# Patient Record
Sex: Female | Born: 1960 | Race: Black or African American | Hispanic: No | Marital: Single | Smoking: Former smoker
Health system: Southern US, Community
[De-identification: ages and names within clinical notes are randomized; demographics above are authoritative.]

## PROBLEM LIST (undated history)

## (undated) DIAGNOSIS — J45909 Unspecified asthma, uncomplicated: Secondary | ICD-10-CM

## (undated) DIAGNOSIS — K219 Gastro-esophageal reflux disease without esophagitis: Secondary | ICD-10-CM

## (undated) DIAGNOSIS — E079 Disorder of thyroid, unspecified: Secondary | ICD-10-CM

## (undated) DIAGNOSIS — K259 Gastric ulcer, unspecified as acute or chronic, without hemorrhage or perforation: Secondary | ICD-10-CM

## (undated) HISTORY — DX: Disorder of thyroid, unspecified: E07.9

## (undated) HISTORY — DX: Gastro-esophageal reflux disease without esophagitis: K21.9

## (undated) HISTORY — PX: OOPHORECTOMY: SHX86

## (undated) HISTORY — PX: OTHER SURGICAL HISTORY: SHX169

## (undated) HISTORY — DX: Unspecified asthma, uncomplicated: J45.909

## (undated) HISTORY — DX: Gastric ulcer, unspecified as acute or chronic, without hemorrhage or perforation: K25.9

## (undated) HISTORY — PX: ABDOMINAL HYSTERECTOMY: SHX81

---

## 1998-12-08 ENCOUNTER — Other Ambulatory Visit: Admission: RE | Admit: 1998-12-08 | Discharge: 1998-12-08 | Payer: Self-pay | Admitting: Obstetrics and Gynecology

## 1999-07-22 ENCOUNTER — Encounter: Payer: Self-pay | Admitting: Emergency Medicine

## 1999-07-22 ENCOUNTER — Ambulatory Visit (HOSPITAL_COMMUNITY): Admission: AD | Admit: 1999-07-22 | Discharge: 1999-07-22 | Payer: Self-pay | Admitting: Emergency Medicine

## 1999-12-26 ENCOUNTER — Other Ambulatory Visit: Admission: RE | Admit: 1999-12-26 | Discharge: 1999-12-26 | Payer: Self-pay | Admitting: Obstetrics and Gynecology

## 2000-02-15 ENCOUNTER — Ambulatory Visit (HOSPITAL_COMMUNITY): Admission: RE | Admit: 2000-02-15 | Discharge: 2000-02-15 | Payer: Self-pay | Admitting: Obstetrics and Gynecology

## 2000-02-15 ENCOUNTER — Encounter: Payer: Self-pay | Admitting: Obstetrics and Gynecology

## 2000-03-01 ENCOUNTER — Encounter: Payer: Self-pay | Admitting: Obstetrics and Gynecology

## 2000-03-11 ENCOUNTER — Encounter (INDEPENDENT_AMBULATORY_CARE_PROVIDER_SITE_OTHER): Payer: Self-pay

## 2000-03-11 ENCOUNTER — Inpatient Hospital Stay (HOSPITAL_COMMUNITY): Admission: RE | Admit: 2000-03-11 | Discharge: 2000-03-14 | Payer: Self-pay | Admitting: Obstetrics and Gynecology

## 2001-01-28 ENCOUNTER — Other Ambulatory Visit: Admission: RE | Admit: 2001-01-28 | Discharge: 2001-01-28 | Payer: Self-pay | Admitting: Obstetrics and Gynecology

## 2002-02-03 ENCOUNTER — Other Ambulatory Visit: Admission: RE | Admit: 2002-02-03 | Discharge: 2002-02-03 | Payer: Self-pay | Admitting: Obstetrics and Gynecology

## 2002-07-22 ENCOUNTER — Encounter: Payer: Self-pay | Admitting: Obstetrics and Gynecology

## 2002-07-22 ENCOUNTER — Ambulatory Visit (HOSPITAL_COMMUNITY): Admission: RE | Admit: 2002-07-22 | Discharge: 2002-07-22 | Payer: Self-pay | Admitting: Obstetrics and Gynecology

## 2003-01-26 ENCOUNTER — Ambulatory Visit (HOSPITAL_COMMUNITY): Admission: RE | Admit: 2003-01-26 | Discharge: 2003-01-26 | Payer: Self-pay | Admitting: Internal Medicine

## 2003-01-26 ENCOUNTER — Encounter: Payer: Self-pay | Admitting: Internal Medicine

## 2003-02-16 ENCOUNTER — Other Ambulatory Visit: Admission: RE | Admit: 2003-02-16 | Discharge: 2003-02-16 | Payer: Self-pay | Admitting: Obstetrics and Gynecology

## 2003-04-27 ENCOUNTER — Encounter (HOSPITAL_BASED_OUTPATIENT_CLINIC_OR_DEPARTMENT_OTHER): Payer: Self-pay | Admitting: General Surgery

## 2003-05-04 ENCOUNTER — Encounter (INDEPENDENT_AMBULATORY_CARE_PROVIDER_SITE_OTHER): Payer: Self-pay

## 2003-05-04 ENCOUNTER — Ambulatory Visit (HOSPITAL_COMMUNITY): Admission: RE | Admit: 2003-05-04 | Discharge: 2003-05-04 | Payer: Self-pay | Admitting: General Surgery

## 2003-09-05 ENCOUNTER — Inpatient Hospital Stay (HOSPITAL_COMMUNITY): Admission: AD | Admit: 2003-09-05 | Discharge: 2003-09-05 | Payer: Self-pay | Admitting: Obstetrics and Gynecology

## 2003-09-06 ENCOUNTER — Encounter (INDEPENDENT_AMBULATORY_CARE_PROVIDER_SITE_OTHER): Payer: Self-pay | Admitting: *Deleted

## 2003-09-06 ENCOUNTER — Inpatient Hospital Stay (HOSPITAL_COMMUNITY): Admission: RE | Admit: 2003-09-06 | Discharge: 2003-09-09 | Payer: Self-pay | Admitting: Obstetrics and Gynecology

## 2004-11-09 ENCOUNTER — Other Ambulatory Visit: Admission: RE | Admit: 2004-11-09 | Discharge: 2004-11-09 | Payer: Self-pay | Admitting: Obstetrics and Gynecology

## 2005-03-28 ENCOUNTER — Ambulatory Visit (HOSPITAL_COMMUNITY): Admission: RE | Admit: 2005-03-28 | Discharge: 2005-03-28 | Payer: Self-pay | Admitting: Internal Medicine

## 2005-11-12 ENCOUNTER — Other Ambulatory Visit: Admission: RE | Admit: 2005-11-12 | Discharge: 2005-11-12 | Payer: Self-pay | Admitting: Obstetrics and Gynecology

## 2006-11-14 ENCOUNTER — Other Ambulatory Visit: Admission: RE | Admit: 2006-11-14 | Discharge: 2006-11-14 | Payer: Self-pay | Admitting: Obstetrics and Gynecology

## 2007-03-10 ENCOUNTER — Encounter: Admission: RE | Admit: 2007-03-10 | Discharge: 2007-03-10 | Payer: Self-pay | Admitting: Gastroenterology

## 2007-12-02 ENCOUNTER — Other Ambulatory Visit: Admission: RE | Admit: 2007-12-02 | Discharge: 2007-12-02 | Payer: Self-pay | Admitting: Obstetrics and Gynecology

## 2008-01-15 ENCOUNTER — Ambulatory Visit (HOSPITAL_COMMUNITY): Admission: RE | Admit: 2008-01-15 | Discharge: 2008-01-15 | Payer: Self-pay | Admitting: Internal Medicine

## 2008-12-06 ENCOUNTER — Ambulatory Visit: Payer: Self-pay | Admitting: Obstetrics and Gynecology

## 2008-12-06 ENCOUNTER — Encounter: Payer: Self-pay | Admitting: Obstetrics and Gynecology

## 2008-12-06 ENCOUNTER — Other Ambulatory Visit: Admission: RE | Admit: 2008-12-06 | Discharge: 2008-12-06 | Payer: Self-pay | Admitting: Obstetrics and Gynecology

## 2008-12-08 ENCOUNTER — Ambulatory Visit: Payer: Self-pay | Admitting: Obstetrics and Gynecology

## 2009-12-19 ENCOUNTER — Ambulatory Visit (HOSPITAL_COMMUNITY): Admission: RE | Admit: 2009-12-19 | Discharge: 2009-12-19 | Payer: Self-pay | Admitting: Internal Medicine

## 2009-12-27 ENCOUNTER — Other Ambulatory Visit: Admission: RE | Admit: 2009-12-27 | Discharge: 2009-12-27 | Payer: Self-pay | Admitting: Obstetrics and Gynecology

## 2009-12-27 ENCOUNTER — Ambulatory Visit: Payer: Self-pay | Admitting: Obstetrics and Gynecology

## 2010-01-02 ENCOUNTER — Ambulatory Visit: Payer: Self-pay | Admitting: Obstetrics and Gynecology

## 2011-01-31 ENCOUNTER — Encounter (INDEPENDENT_AMBULATORY_CARE_PROVIDER_SITE_OTHER): Payer: 59 | Admitting: Obstetrics and Gynecology

## 2011-01-31 ENCOUNTER — Other Ambulatory Visit (HOSPITAL_COMMUNITY)
Admission: RE | Admit: 2011-01-31 | Discharge: 2011-01-31 | Disposition: A | Discharge status: Home / Self Care | Payer: 59 | Source: Ambulatory Visit | Attending: Obstetrics and Gynecology | Admitting: Obstetrics and Gynecology

## 2011-01-31 ENCOUNTER — Other Ambulatory Visit: Payer: Self-pay | Admitting: Obstetrics and Gynecology

## 2011-01-31 DIAGNOSIS — Z833 Family history of diabetes mellitus: Secondary | ICD-10-CM

## 2011-01-31 DIAGNOSIS — Z01419 Encounter for gynecological examination (general) (routine) without abnormal findings: Secondary | ICD-10-CM

## 2011-01-31 DIAGNOSIS — Z13220 Encounter for screening for lipoid disorders: Secondary | ICD-10-CM

## 2011-01-31 DIAGNOSIS — E039 Hypothyroidism, unspecified: Secondary | ICD-10-CM

## 2011-01-31 DIAGNOSIS — Z124 Encounter for screening for malignant neoplasm of cervix: Secondary | ICD-10-CM | POA: Insufficient documentation

## 2011-02-09 NOTE — Op Note (Signed)
NAME:  Charlene Mitchell, Charlene Mitchell                           ACCOUNT NO.:  [id number]   MEDICAL RECORD NO.:  [id number]                   PATIENT TYPE:  INP   LOCATION:  9306                                 FACILITY:  [facility]   PHYSICIAN:  Daniel L. Eda Paschal, M.D.           DATE OF BIRTH:  1961-07-18   DATE OF PROCEDURE:  09/06/2003  DATE OF DISCHARGE:                                 OPERATIVE REPORT   PREOPERATIVE DIAGNOSES:  1. Menorrhagia with anemia.  2. Fibroids.  3. Left adnexal mass.   POSTOPERATIVE DIAGNOSES:  1. Menorrhagia with anemia.  2. Fibroids.  3. Left adnexal mass.  4. Endometrioma of left ovary.   OPERATION:  Exploratory laparotomy, lysis of pelvic adhesions, total  abdominal hysterectomy, left salpingo-oophorectomy, excision of  endometriosis of cul-de-sac.   SURGEON:  Daniel L. Eda Paschal, M.D.   FIRST ASSISTANT:  Timothy P. Fontaine, M.D.   FINDINGS:  At the time of laparotomy, the patient had massive adhesions of  sigmoid colon to the left adnexa as well as to the top of the fundus.  Once  these were freed up, her uterus was enlarged by multiple fibroids.  The  patient had a 7 cm endometrioma of her left ovary.  She also had  endometriosis in the cul-de-sac of 2 to 3 cm that was pigmented.  The  patient had a small functional cyst of the right ovary which did not appear  to be neoplastic, otherwise the right ovary and tube were normal.   DESCRIPTION OF PROCEDURE:  After adequate general endotracheal anesthesia,  the patient was placed in the supine position, prepped and draped in the  usual sterile manner.  Foley catheter was inserted in the patient's bladder.  A Pfannenstiel incision was made.  The fascia was opened transversely.  At  this point, it was obvious that bowel was adherent to the peritoneum as well  as to the uterus.  A small area was found where the peritoneum could be  safely entered without injuring bowel.  At this point, it was felt that to  have  adequate exposure to do the surgery, the peritoneum should be opened  transversely.  The inferior epigastric arteries and veins were doubly  ligated with #1 chromic bilaterally.  The rectus muscle was then bovied to  separate it.  The peritoneum was then completely opened transversely, taking  care not to injure the bowel which was adherent to it.  Once the peritoneal  cavity was opened, peritoneal washings were obtained.  Following this,  massive adhesions of the sigmoid colon to the uterus and the left adnexa  were freed up.  This was done carefully and the bowel was not injured during  any of this.  At one point, however, the left adnexal mass ruptured and it  drained obvious chocolate endometriotic fluid.  Following lysis of all  adhesions, the left adnexa could be elevated.  The left round  ligament was  bovied and cut.  The retroperitoneal space was entered.  The ureter was  identified.  The IP ligament on the left was then clamped, cut, and doubly  suture ligated with 0 chromic.  On the right side, the ovary and tube were  adherent to the uterus but they could be freed off of it so that the ovary  and tube on the right could be spared and left and then the attachments of  the ovary and tube to the uterus were clamped, cut, and suture ligated with  #1 chromic catgut.  At this point, a small functional system of the right  ovary ruptured.  It was clearly not a neoplastic cyst. It was probably 1.5  cm.  It may have been the one that was identified on ultrasound back in  September.  There was no other obvious pathology on the right ovary.  Following freeing up of the uterus completely from adherence to both the  bladder flap and to the bowel, the round ligament on the right could then be  clamped, cut, and suture ligated with 3-0 chromic catgut.  The bladder flap  was advanced by sharp dissection as was the posterior peritoneum.  The  uterine arteries were clamped, cut, and doubly suture  ligated with #1  chromic catgut.  The balance of the cardinal ligaments and uterosacral  ligaments were clamped, cut, and suture ligated with #1 chromic catgut.  The  cervicovaginal junction was identified and entered with sharp dissection,  trimmed around.  Uterus, left tube and ovary were sent to pathology for  tissue diagnosis.  Angle sutures were placed in the angles of the vagina  with #1 chromic catgut and then the cuff was closed with figure-of-eights of  #1 chromic catgut.  Uterosacral and cardinal ligaments were supported to the  cuff for good bold support.  The area of endometriosis in the cul-de-sac was  sharply excised and the defect in the perineum was closed with a 3-0 Vicryl.  This was done mostly to control bleeding.  Two sponge, needle and instrument  counts were correct.  Peritoneal cavity was irrigated with copious Ringer's  lactate.  It was removed.  The fascia was closed with two running 0 Vicryl  and the skin was closed with staples.  Estimated blood loss for the entire  procedure was 500 mL with none replaced.  The patient left the operating  room after draining 450 mL of clear urine.  It should be noted that  preoperatively before the patient was given any sedation, we discussed the  fact that her preoperative hemoglobin was 8 due to menomenorrhagia.  We  discussed the risks of surgery including need for transfusion and risks of  HIV and hepatitis B and C.  This was also discussed with the patient by  anesthesia.  The patient is comfortable with these risks and would like to  proceed with the surgery rather than trying to be suppressed which might or  might not be possible with her large myomas and the endometriosis.                                               Daniel L. Eda Paschal, M.D.    Tonette Bihari  D:  09/06/2003  T:  09/06/2003  Job:  [id number]

## 2011-02-09 NOTE — Discharge Summary (Signed)
NAME:  Charlene Mitchell, Charlene Mitchell                           ACCOUNT NO.:  [id number]   MEDICAL RECORD NO.:  [id number]                   PATIENT TYPE:  INP   LOCATION:  9306                                 FACILITY:  [facility]   PHYSICIAN:  Daniel L. Eda Paschal, M.D.           DATE OF BIRTH:  05-25-1961   DATE OF ADMISSION:  09/06/2003  DATE OF DISCHARGE:  09/09/2003                                 DISCHARGE SUMMARY   DISCHARGE DIAGNOSES:  1. Menometrorrhagia with anemia.  2. Fibroids.  3. Left adnexal mass.  4. Endometrioma by left ovary.  5. Status post exploratory laparotomy, lysis of pelvic adhesions, total     abdominal hysterectomy, left salpingo-oophorectomy, excision of     endometriosis in cul-de-sac by Reuel Boom L. Eda Paschal, M.D. on September 06, 2003.   HISTORY:  This is a 50 year old female (gravida 0) who presented with a  persistent menometrorrhagia and left lower quadrant pain.  Periods were  lasting up to 14 days.  The patient did not feel that she desired pregnancy.  The patient was found on ultrasound to have a lower left ovarian cyst (8 x 8  x 5 cm), mostly avascular sequela.  There are two cystic areas with  septation between them; _____ 125 with _______ in the 30's.  She is also  found to have very large fibroids.  The patient presented for definitive  surgery.  The patient was also found to be anemic and preoperative  hemoglobin on September 06, 2003 was 8.0.   HOSPITAL COURSE:  The patient admitted on September 06, 2003 for definitive  surgery, and was counseled because of hemoglobin of 8.0 with the risks of  surgery and the need for a transfusion.  The patient desired the surgery.  Therefore, the patient underwent a exploratory laparotomy, lysis of pelvic  adhesions, Total abdominal hysterectomy, left salpingo-oophorectomy,  excision of endometriosis of the cul-de-sac and biopsy without  complications.   At the time of laparotomy the patient had massive adhesions of the  sigmoid  colon to the left adnexa, as well as to the top of the fundus.  The uterus  was found to be enlarged with multiple fibroids.  The patient had a 7 cm  endometrioma of the left ovary, also endometriosis in the cul-de-sac of 2-3  cm that was pigmented.  The patient had a small functional cyst on the right  ovary, which did not appear to be neoplastic; otherwise the right ovary and  tube were normal.   Postoperatively on September 07, 2003, the patient had a temperature max of  101; however, this decreased on the a.m. of September 08, 2003 to 100.  She  was without symptoms except for a headache.  She was voiding and in stable  condition.   On September 08, 2004 she was afebrile, voiding and in stable condition.  She  was felt stable for discharge.  Her  hemoglobin had been 6.0 postoperatively.  The patient was tolerating this well.   LABS:  September 07, 2003 -- Hemoglobin 6.0.   DISPOSITION:  The patient is discharged to home.   DISCHARGE MEDICATIONS:  Chromagen, Vicodin p.r.n. pain.   FOLLOW UP:  In three weeks to see M.D.  If any problems prior to that, plan  to be seen at the office.     Susa Loffler, P.A.                    Daniel L. Eda Paschal, M.D.    Ardath Sax  D:  10/04/2003  T:  10/04/2003  Job:  [id number]

## 2011-02-09 NOTE — Discharge Summary (Signed)
[facility]  Patient:    Charlene Mitchell, Charlene Mitchell                        MRN: [id number] Adm. Date:  [id number] Disc. Date: [id number] Attending:  Sharon Mt                           Discharge Summary  HISTORY OF PRESENT ILLNESS:  Patient is a 50 year old female with enlarging fibroids.  She entered the hospital for multiple myomectomies.  On the day of admission, through an exploratory laparotomy incision, multiple myomectomies were performed without difficulty.  Patients hemoglobin had been low to start with at 9 and it dropped postoperatively to 6.3, but patient was tolerating this well without tachycardia and was able to ambulate without difficulty, so she was not transfused.  She had an ileus postoperatively that responded to intravenous fluids and Dulcolax suppositories and by the third postoperative day she was doing well and passing gas.  DISCHARGE MEDICATIONS:  She was discharged on Percocet for pain relief and ferrous sulfate 325 mg b.i.d. for iron replacement.  DISCHARGE INSTRUCTIONS:  Soft diet to advance as tolerated.  Wound care was routine.  Condition on discharge was improved.  FINAL PATHOLOGY REPORT:  Multiple leiomyoma, all benign, which total weight not available at time of dictation.  DISCHARGE DIAGNOSIS:  Symptomatic fibroids.  OPERATION:  Multiple myomectomies. DD:  03/14/00 TD:  03/14/00 Job: [id number]

## 2011-02-09 NOTE — Op Note (Signed)
NAME:  Charlene Mitchell, Charlene Mitchell                           ACCOUNT NO.:  [id number]   MEDICAL RECORD NO.:  [id number]                   PATIENT TYPE:  AMB   LOCATION:  DAY                                  FACILITY:  [facility]   PHYSICIAN:  Leonie Man, M.D.                DATE OF BIRTH:  [birth year]   DATE OF PROCEDURE:  05/04/2003  DATE OF DISCHARGE:                                 OPERATIVE REPORT   PREOPERATIVE DIAGNOSIS:  Hemorrhoidal disease.   POSTOPERATIVE DIAGNOSIS:  Hemorrhoidal disease.   PROCEDURE:  Proctosigmoidoscopy to 25 cm and PPH stapled hemorrhoidectomy  for complex hemorrhoidal disease.   SURGEON:  Leonie Man, M.D.   ASSISTANT:  Nurse.   ANESTHESIA:  General.   CONSULTATIONS:  Timothy E. Earlene Plater, M.D.   INDICATIONS FOR PROCEDURE:  Note, this patient is a 50 year old woman with  severe grade 3 hemorrhoidal disease with some bleeding and recurrent pain.  She comes to the operating room now for hemorrhoidectomy. The risks and  potential benefits of surgery have been fully discussed, all questions  answered and consent obtained.   DESCRIPTION OF PROCEDURE:  Following the induction of satisfactory general  anesthesia, the patient was positioned in the prone jack knife position. The  perianal tissues are prepped and draped to be included in the sterile  operative field. Using a sterile sigmoidoscope, I then placed it through the  anus and I followed it up into the sigmoid colon up to 25 cm. There were no  mucosal abnormalities noted except at the anal verge where she had  hemorrhoids. The anus was then serially dilated with increasing sizes of the  anal dilators. The PPH endoscope was put in place and a pursestring suture  of 2-0 Prolene was placed approximately 4 1/2 to 5 cm above the dentate  line. The vagina was checked to ascertain that there was no entry into the  vagina through the pursestring. The PPH stapling device was then placed  inside the pursestring and the  pursestring drawn taught and tied. This was  checked again and the vagina checked a second time. The PPH stapling device  was then screwed into place down to the 4 cm mark tightening the pursestring  suture. I held that there for approximately one minute to allow hemostasis  and then fired the stapler. I allowed another one minute of hemostasis. The  stapler was then removed and the mucosal ring was removed. There was no  evidence of any sphincter muscle within the mucosal ring that was removed.  The suture line was then checked for hemostasis and noted to be dry. Sponge,  instrument and sharp counts were verified. I placed a Gelfoam roll into the  anus at the staple line and left that in place. Sterile dressings were then  applied to the wound, the anesthetic reversed and the patient removed from  the operating room to the recovery  room in stable condition. She tolerated  the procedure well.                                              Leonie Man, M.D.   PB/MEDQ  D:  05/04/2003  T:  05/04/2003  Job:  [id number]

## 2011-02-09 NOTE — H&P (Signed)
NAME:  Charlene Mitchell, Charlene Mitchell                           ACCOUNT NO.:  [id number]   MEDICAL RECORD NO.:  [id number]                   PATIENT TYPE:  INP   LOCATION:  NA                                   FACILITY:  [facility]   PHYSICIAN:  Daniel L. Eda Paschal, M.D.           DATE OF BIRTH:  [birth year]   DATE OF ADMISSION:  09/06/2003  DATE OF DISCHARGE:                                HISTORY & PHYSICAL   CHIEF COMPLAINT:  Menometrorrhagia with left lower quadrant pain with large  fibroids and left ovarian mass.   HISTORY OF PRESENT ILLNESS:  The patient is a 49 year old nulligravida who  had presented this fall to the office with a two to three-month history of  persistent menometrorrhagia with her periods and left lower quadrant pain.  Her periods are lasting 14 days and she is very uncomfortable with them.  She does not feel that she wants to get pregnant and have children.  On  ultrasound, the patient had a large left ovarian cyst which was 8 x 8 x 5  cm.  It is mostly avascular.  It is thick walled.  It appears to be two  cystic areas with a septation between them.  Her CA125 was borderline in the  low 30s.  The patient also had very large fibroids.  Two of them were  submucous and were 2.5 cm each.  She also had multiple intramural myomas for  a total of approximately 14 of them.  On the right ovary, she had a small  cystic mass of 2 cm with internal septations.  The patient's past medical  history reveals that she has had myomectomies in the past.  In 1993 and in  2001, she underwent abdominal myomectomies and in 1998 she underwent a  hysteroscopy myomectomy for a submucous fibroid that was confined to the  cavity.  She now enters the hospital for total abdominal hysterectomy and  left salpingo-oophorectomy.  We plan to leave her right ovary unless there  is a malignancy.  We will make sure that the cyst that was seen on  ultrasound on the right, which is small, is removed assuming that it is  still present.   PAST SURGICAL HISTORY:  See above.   PAST MEDICAL HISTORY:  The patient also suffers with migraines and  hypothyroidism.   MEDICATIONS:  1. Synthroid 0.125 mg daily.  2. Relpax.  3. Protonix for reflux.  4. Proctoform HC for hemorrhoids.   ALLERGIES:  She is allergic to no drugs.   FAMILY HISTORY:  The patient's grandmother on her mother's side had breast  cancer.  Her father has had prostate cancer.  Her mother and father are both  hypertensive.  She has a brother who is diabetic.   SOCIAL HISTORY:  She is a nonsmoker and a nondrinker.   REVIEW OF SYSTEMS:  HEENT:  History of headaches.  CARDIAC:  Negative.  GASTROINTESTINAL:  GERD.  RESPIRATORY:  Negative.  MUSCULOSKELETAL:  Negative.  GENITOURINARY:  Frequent urination.  NEUROLOGIC/PSYCHIATRIC:  Negative.  ALLERGIC:  Negative.  LYMPHATIC:  Negative.  ENDOCRINE:  Negative.   PHYSICAL EXAMINATION:  GENERAL APPEARANCE:  The patient is a well-developed,  well-nourished female in no acute distress.  VITAL SIGNS:  The blood pressure is 130/80, the pulse is 80 and regular, and  respirations 16 and nonlabored.  She is afebrile.  HEENT:  All within normal limits.  NECK:  Supple.  Trachea in the midline.  The thyroid is not enlarged.  LUNGS:  Clear to P&A.  HEART:  No thrills, heaves, or murmurs.  BREASTS:  No masses.  ABDOMEN:  Soft without guarding, rebound, or masses.  PELVIC:  External and vaginal are normal.  The cervix is clear.  The uterus  is enlarged to 12-14 week size with what is suggestive of a mass on the left  side versus fibroids.  The right adnexa is palpably normal.  Rectovaginal is  confirmatory.   IMPRESSION:  1. Recurrent myomas.  2. Menometrorrhagia.  3. Left adnexal mass.   PLAN:  Exploratory laparotomy with appropriate surgery as noted above.                                               Daniel L. Eda Paschal, M.D.    Charlene Mitchell  D:  09/05/2003  T:  09/05/2003  Job:  [id number]

## 2012-11-18 ENCOUNTER — Other Ambulatory Visit: Payer: Self-pay | Admitting: Internal Medicine

## 2012-11-18 ENCOUNTER — Ambulatory Visit (HOSPITAL_COMMUNITY)
Admission: RE | Admit: 2012-11-18 | Discharge: 2012-11-18 | Disposition: A | Discharge status: Home / Self Care | Payer: 59 | Source: Ambulatory Visit | Attending: Internal Medicine | Admitting: Internal Medicine

## 2012-11-18 DIAGNOSIS — R05 Cough: Secondary | ICD-10-CM

## 2012-11-18 DIAGNOSIS — R059 Cough, unspecified: Secondary | ICD-10-CM | POA: Insufficient documentation

## 2012-11-18 DIAGNOSIS — J9819 Other pulmonary collapse: Secondary | ICD-10-CM | POA: Insufficient documentation

## 2012-11-18 DIAGNOSIS — R0989 Other specified symptoms and signs involving the circulatory and respiratory systems: Secondary | ICD-10-CM | POA: Insufficient documentation

## 2012-11-18 DIAGNOSIS — R509 Fever, unspecified: Secondary | ICD-10-CM | POA: Insufficient documentation

## 2012-11-18 DIAGNOSIS — J984 Other disorders of lung: Secondary | ICD-10-CM | POA: Insufficient documentation

## 2014-08-11 ENCOUNTER — Ambulatory Visit (HOSPITAL_COMMUNITY)
Admission: RE | Admit: 2014-08-11 | Discharge: 2014-08-11 | Disposition: A | Discharge status: Home / Self Care | Payer: 59 | Source: Ambulatory Visit | Attending: Internal Medicine | Admitting: Internal Medicine

## 2014-08-11 ENCOUNTER — Other Ambulatory Visit: Payer: Self-pay | Admitting: Internal Medicine

## 2014-08-11 DIAGNOSIS — M5030 Other cervical disc degeneration, unspecified cervical region: Secondary | ICD-10-CM | POA: Diagnosis not present

## 2014-08-11 DIAGNOSIS — M542 Cervicalgia: Secondary | ICD-10-CM | POA: Diagnosis not present

## 2015-01-24 ENCOUNTER — Other Ambulatory Visit: Payer: Self-pay

## 2015-02-25 ENCOUNTER — Encounter: Payer: Self-pay | Admitting: Women's Health

## 2015-02-25 ENCOUNTER — Ambulatory Visit (INDEPENDENT_AMBULATORY_CARE_PROVIDER_SITE_OTHER): Payer: 59 | Admitting: Women's Health

## 2015-02-25 VITALS — BP 122/78 | Ht 63.0 in | Wt 206.6 lb

## 2015-02-25 DIAGNOSIS — I10 Essential (primary) hypertension: Secondary | ICD-10-CM | POA: Diagnosis not present

## 2015-02-25 DIAGNOSIS — Z01419 Encounter for gynecological examination (general) (routine) without abnormal findings: Secondary | ICD-10-CM | POA: Diagnosis not present

## 2015-02-25 DIAGNOSIS — E038 Other specified hypothyroidism: Secondary | ICD-10-CM

## 2015-02-25 DIAGNOSIS — N898 Other specified noninflammatory disorders of vagina: Secondary | ICD-10-CM

## 2015-02-25 DIAGNOSIS — N760 Acute vaginitis: Secondary | ICD-10-CM

## 2015-02-25 DIAGNOSIS — A499 Bacterial infection, unspecified: Secondary | ICD-10-CM

## 2015-02-25 DIAGNOSIS — E039 Hypothyroidism, unspecified: Secondary | ICD-10-CM | POA: Insufficient documentation

## 2015-02-25 DIAGNOSIS — B9689 Other specified bacterial agents as the cause of diseases classified elsewhere: Secondary | ICD-10-CM

## 2015-02-25 LAB — WET PREP FOR TRICH, YEAST, CLUE
Trich, Wet Prep: NONE SEEN
Yeast Wet Prep HPF POC: NONE SEEN

## 2015-02-25 MED ORDER — METRONIDAZOLE 500 MG PO TABS: 500.0000 mg | ORAL_TABLET | Freq: Two times a day (BID) | ORAL | Status: DC

## 2015-02-25 NOTE — Progress Notes (Signed)
Charlene Mitchell 11/13/1960 [id number]    History:    Presents for annual exam.  2004 TAH with [facility]SO for fibroids and endometriosis on no HRT with no menopausal symptoms. Negative colonoscopy at 50. Normal Pap and mammogram history. Last here in 2012, had been helping to care for parents who have died. Hypertension/hypothyroidism managed by primary care.   Past medical history, past surgical history, family history and social history were all reviewed and documented in the EPIC chart. Works at the [employer]. Raised a niece. Parents hypertension, brother diabetes, mother died of leukemia, father died of prostate cancer.  ROS:  A ROS was performed and pertinent positives and negatives are included.  Exam:  Filed Vitals:   02/25/15 0928  BP: 122/78    General appearance:  Normal Thyroid:  Symmetrical, normal in size, without palpable masses or nodularity. Respiratory  Auscultation:  Clear without wheezing or rhonchi Cardiovascular  Auscultation:  Regular rate, without rubs, murmurs or gallops  Edema/varicosities:  Not grossly evident Abdominal  Soft,nontender, without masses, guarding or rebound.  Liver/spleen:  No organomegaly noted  Hernia:  None appreciated  Skin  Inspection:  Grossly normal   Breasts: Examined lying and sitting.     Right: Without masses, retractions, discharge or axillary adenopathy.     Left: Without masses, retractions, discharge or axillary adenopathy. Gentitourinary   Inguinal/mons:  Normal without inguinal adenopathy  External genitalia:  Normal  BUS/Urethra/Skene's glands:  Normal  Vagina:  Moderate white discharge with odor wet prep positive for clues, TNTC bacteria  Cervix:  And uterus absent  Adnexa/parametria:     Rt: Without masses or tenderness.   Lt: Without masses or tenderness.  Anus and perineum: Normal  Digital rectal exam: Normal sphincter tone without palpated masses or tenderness  Assessment/Plan:  54 y.o.  SBF G0 for annual exam with no  complaints  BV TAH with [facility] for fibroids and endometriosis without menopausal symptoms Hypertension/hypothyroidism-managed by primary care labs and meds Obesity  Plan: Flagyl 500 twice daily for 7 days #14 alcohol precautions reviewed. Instructed to call if no relief of discharge. SBE's, annual screening mammogram, has one scheduled today. Increase exercise and decrease calories for weight loss encouraged. UHarrington Challenger.  YOUNG,NANCY J [facility] HospitalWHNP, 9:58 AM 02/25/2015

## 2015-02-25 NOTE — Patient Instructions (Signed)

## 2015-02-28 ENCOUNTER — Encounter: Payer: Self-pay | Admitting: Women's Health

## 2015-11-07 ENCOUNTER — Ambulatory Visit (INDEPENDENT_AMBULATORY_CARE_PROVIDER_SITE_OTHER): Payer: 59 | Admitting: Podiatry

## 2015-11-07 ENCOUNTER — Encounter: Payer: Self-pay | Admitting: Podiatry

## 2015-11-07 ENCOUNTER — Ambulatory Visit (INDEPENDENT_AMBULATORY_CARE_PROVIDER_SITE_OTHER): Payer: 59

## 2015-11-07 VITALS — BP 121/76 | HR 101 | Resp 16 | Ht 63.0 in | Wt 215.0 lb

## 2015-11-07 DIAGNOSIS — M779 Enthesopathy, unspecified: Secondary | ICD-10-CM

## 2015-11-07 DIAGNOSIS — M79672 Pain in left foot: Secondary | ICD-10-CM | POA: Diagnosis not present

## 2015-11-07 DIAGNOSIS — M722 Plantar fascial fibromatosis: Secondary | ICD-10-CM

## 2015-11-07 DIAGNOSIS — M79671 Pain in right foot: Secondary | ICD-10-CM

## 2015-11-07 MED ORDER — PREDNISONE 10 MG PO TABS: ORAL_TABLET | ORAL | Status: DC

## 2015-11-07 MED ORDER — TRIAMCINOLONE ACETONIDE 10 MG/ML IJ SUSP
10.0000 mg | Freq: Once | INTRAMUSCULAR | Status: AC
  Administered 2015-11-07: 10 mg

## 2015-11-07 NOTE — Patient Instructions (Signed)

## 2015-11-07 NOTE — Progress Notes (Signed)
   Subjective:    Patient ID: Charlene Mitchell, female    DOB: [birth year], 55 y.o.   MRN: [id number]  HPI Patient presents with bilateral foot pain; Lateral & medial side. Also having swelling, burning, numbness & tingling sensation in toes. Pt stated, "starts at 5th toe and radiates to the other toes"; x6 months.   Review of Systems  HENT: Positive for sinus pressure.   Musculoskeletal: Positive for myalgias.  Neurological: Positive for headaches.  All other systems reviewed and are negative.      Objective:   Physical Exam        Assessment & Plan:

## 2015-11-08 NOTE — Progress Notes (Signed)
Subjective:     Patient ID: Charlene Mitchell, female   DOB: July 28, 1961, 55 y.o.   MRN: [id number]  HPI patient states she has developed a lot of pain in the outside of her right over left foot and states that she's also had a long-term history of plantar fasciitis which is doing pretty well at this time but still can get sore   Review of Systems     Objective:   Physical Exam Neurovascular status intact with muscle strength adequate and patient noted to have fusiform edema in the peroneal brevis tendon insertion base of fifth metatarsal with pain in this area right over left. Patient is also noted to have moderate discomfort in the plantar heel region bilateral but not acute at the current time    Assessment:     Peroneal tendinitis right over left which may be due to activity changes or structural changes or gait changes secondary to plantar fasciitis    Plan:     X-rays of both feet reviewed and at this time careful sheath injection administered right 3 mg dexamethasone Kenalog 5 mg Xylocaine and brace applied to lift the lateral side of the foot right. Discussed ice therapy reduced activity shoe gear modification and also discussed plantar fasciitis and continue treatment of this condition. Patient will be seen back for [place] to recheck again in the next several weeks  X-ray report indicated spur formation but no indications of stress reactions around the base of the fifth metatarsal or other pathology

## 2015-11-21 ENCOUNTER — Encounter: Payer: Self-pay | Admitting: Podiatry

## 2015-11-21 ENCOUNTER — Ambulatory Visit (INDEPENDENT_AMBULATORY_CARE_PROVIDER_SITE_OTHER): Payer: 59 | Admitting: Podiatry

## 2015-11-21 DIAGNOSIS — M779 Enthesopathy, unspecified: Secondary | ICD-10-CM | POA: Diagnosis not present

## 2015-11-22 NOTE — Progress Notes (Signed)
Subjective:     Patient ID: Charlene Mitchell, female   DOB: Feb 13, 1961, 55 y.o.   MRN: [id number]  HPI patient presents stating I'm doing quite a bit better with still mild to moderate discomfort   Review of Systems     Objective:   Physical Exam Neurovascular status intact with continued discomfort lateral side tendon complex bilateral that's improved with bracing and medication but still occasional tenderness    Assessment:     Tendinitis improved but present lateral side feet    Plan:     Advised on physical therapy ice therapy and continued brace usage and reappoint as needed

## 2016-02-29 ENCOUNTER — Encounter: Payer: Self-pay | Admitting: Women's Health

## 2016-02-29 ENCOUNTER — Ambulatory Visit (INDEPENDENT_AMBULATORY_CARE_PROVIDER_SITE_OTHER): Payer: 59 | Admitting: Women's Health

## 2016-02-29 VITALS — BP 126/80 | Ht 63.0 in | Wt 210.0 lb

## 2016-02-29 DIAGNOSIS — Z13820 Encounter for screening for osteoporosis: Secondary | ICD-10-CM | POA: Diagnosis not present

## 2016-02-29 DIAGNOSIS — Z01419 Encounter for gynecological examination (general) (routine) without abnormal findings: Secondary | ICD-10-CM

## 2016-02-29 NOTE — Patient Instructions (Signed)

## 2016-02-29 NOTE — Progress Notes (Signed)
Charlene Mitchell 02/07/1961 [id number]    History:    Presents for annual exam.  2004 TAH with LSO for endometriosis on no HRT. Negative colonoscopy at age 55. Normal Pap and mammogram history. Had a mammogram this a.m. Hypertension and hypothyroidism managed by primary care. Same partner greater than 10 years.  Past medical history, past surgical history, family history and social history were all reviewed and documented in the EPIC chart. Works at the [employer]. Helped raise a niece who is now back living with her mother at age 55 and making some poor choices.  ROS:  A ROS was performed and pertinent positives and negatives are included.  Exam:  Filed Vitals:   02/29/16 0911  BP: 126/80    General appearance:  Normal Thyroid:  Symmetrical, normal in size, without palpable masses or nodularity. Respiratory  Auscultation:  Clear without wheezing or rhonchi Cardiovascular  Auscultation:  Regular rate, without rubs, murmurs or gallops  Edema/varicosities:  Not grossly evident Abdominal  Soft,nontender, without masses, guarding or rebound.  Liver/spleen:  No organomegaly noted  Hernia:  None appreciated  Skin  Inspection:  Grossly normal   Breasts: Examined lying and sitting. Pendulous    Right: Without masses, retractions, discharge or axillary adenopathy.     Left: Without masses, retractions, discharge or axillary adenopathy. Gentitourinary   Inguinal/mons:  Normal without inguinal adenopathy  External genitalia:  Normal  BUS/Urethra/Skene's glands:  Normal  Vagina:  Normal  Cervix:  Uterus absent  Adnexa/parametria:     Rt: Without masses or tenderness.   Lt: Without masses or tenderness.  Anus and perineum: Normal  Digital rectal exam: Normal sphincter tone without palpated masses or tenderness  Assessment/Plan:  55 y.o. SBF G0 for annual exam with no complaints.  2004 TAH with LSO for endometriosis on no HRT Hypertension/hyperthyroidism-primary care manages labs and  meds Obesity  Plan: SBE's, continue annual 3-D screening mammogram/pendulous breasts. Continue healthy diet and exercise process of losing weight. Schedule DEXA,  importance of weightbearing exercise reviewed. Vitamin D over-the-counter 2000 IUs daily. UA  Harrington ChallengerYOUNG,NANCY J [facility] CenterWHNP, 9:48 AM 02/29/2016

## 2016-03-01 LAB — URINALYSIS W MICROSCOPIC + REFLEX CULTURE
BACTERIA UA: NONE SEEN [HPF]
BILIRUBIN URINE: NEGATIVE
Casts: NONE SEEN [LPF]
Crystals: NONE SEEN [HPF]
GLUCOSE, UA: NEGATIVE
HGB URINE DIPSTICK: NEGATIVE
KETONES UR: NEGATIVE
LEUKOCYTES UA: NEGATIVE
NITRITE: NEGATIVE
Protein, ur: NEGATIVE
SPECIFIC GRAVITY, URINE: 1.018 (ref 1.001–1.035)
YEAST: NONE SEEN [HPF]
pH: 5.5 (ref 5.0–8.0)

## 2016-03-04 LAB — URINE CULTURE

## 2016-03-06 ENCOUNTER — Other Ambulatory Visit: Payer: Self-pay | Admitting: Women's Health

## 2016-03-06 MED ORDER — CIPROFLOXACIN HCL 250 MG PO TABS: 250.0000 mg | ORAL_TABLET | Freq: Two times a day (BID) | ORAL | Status: DC

## 2016-03-13 ENCOUNTER — Ambulatory Visit (INDEPENDENT_AMBULATORY_CARE_PROVIDER_SITE_OTHER): Payer: 59

## 2016-03-13 DIAGNOSIS — Z13820 Encounter for screening for osteoporosis: Secondary | ICD-10-CM

## 2016-04-13 ENCOUNTER — Other Ambulatory Visit: Payer: Self-pay | Admitting: Women's Health

## 2016-04-17 ENCOUNTER — Other Ambulatory Visit: Payer: Self-pay

## 2016-04-20 ENCOUNTER — Other Ambulatory Visit: Payer: Self-pay | Admitting: Women's Health

## 2016-08-20 ENCOUNTER — Other Ambulatory Visit: Payer: Self-pay | Admitting: Gastroenterology

## 2016-08-20 DIAGNOSIS — R1013 Epigastric pain: Secondary | ICD-10-CM

## 2016-08-27 ENCOUNTER — Ambulatory Visit
Admission: RE | Admit: 2016-08-27 | Discharge: 2016-08-27 | Disposition: A | Discharge status: Home / Self Care | Payer: 59 | Source: Ambulatory Visit | Attending: Gastroenterology | Admitting: Gastroenterology

## 2016-08-27 DIAGNOSIS — R1013 Epigastric pain: Secondary | ICD-10-CM

## 2016-11-20 ENCOUNTER — Other Ambulatory Visit (HOSPITAL_COMMUNITY): Payer: Self-pay | Admitting: Gastroenterology

## 2016-11-20 DIAGNOSIS — K219 Gastro-esophageal reflux disease without esophagitis: Secondary | ICD-10-CM

## 2016-11-27 ENCOUNTER — Ambulatory Visit (HOSPITAL_COMMUNITY)
Admission: RE | Admit: 2016-11-27 | Discharge: 2016-11-27 | Disposition: A | Discharge status: Home / Self Care | Payer: 59 | Source: Ambulatory Visit | Attending: Gastroenterology | Admitting: Gastroenterology

## 2016-11-27 DIAGNOSIS — K219 Gastro-esophageal reflux disease without esophagitis: Secondary | ICD-10-CM | POA: Diagnosis not present

## 2016-11-27 MED ORDER — TECHNETIUM TC 99M SULFUR COLLOID: 2.1000 | Freq: Once | INTRAVENOUS | Status: DC | PRN

## 2017-01-17 ENCOUNTER — Other Ambulatory Visit: Payer: Self-pay | Admitting: Gastroenterology

## 2017-01-17 DIAGNOSIS — R1013 Epigastric pain: Secondary | ICD-10-CM

## 2017-01-21 ENCOUNTER — Ambulatory Visit
Admission: RE | Admit: 2017-01-21 | Discharge: 2017-01-21 | Disposition: A | Discharge status: Home / Self Care | Payer: 59 | Source: Ambulatory Visit | Attending: Gastroenterology | Admitting: Gastroenterology

## 2017-01-21 DIAGNOSIS — R1013 Epigastric pain: Secondary | ICD-10-CM

## 2017-01-21 MED ORDER — IOPAMIDOL (ISOVUE-300) INJECTION 61%
100.0000 mL | Freq: Once | INTRAVENOUS | Status: AC | PRN
  Administered 2017-01-21: 100 mL via INTRAVENOUS

## 2017-02-06 ENCOUNTER — Encounter: Payer: Self-pay | Admitting: Gynecology

## 2017-03-01 ENCOUNTER — Encounter: Payer: 59 | Admitting: Women's Health

## 2017-03-04 ENCOUNTER — Encounter: Payer: Self-pay | Admitting: Women's Health

## 2017-03-04 ENCOUNTER — Ambulatory Visit (INDEPENDENT_AMBULATORY_CARE_PROVIDER_SITE_OTHER): Payer: 59 | Admitting: Women's Health

## 2017-03-04 VITALS — BP 124/78 | Ht 63.0 in | Wt 213.0 lb

## 2017-03-04 DIAGNOSIS — N898 Other specified noninflammatory disorders of vagina: Secondary | ICD-10-CM

## 2017-03-04 DIAGNOSIS — Z01419 Encounter for gynecological examination (general) (routine) without abnormal findings: Secondary | ICD-10-CM | POA: Diagnosis not present

## 2017-03-04 DIAGNOSIS — K640 First degree hemorrhoids: Secondary | ICD-10-CM | POA: Diagnosis not present

## 2017-03-04 LAB — WET PREP FOR TRICH, YEAST, CLUE
Clue Cells Wet Prep HPF POC: NONE SEEN
Trich, Wet Prep: NONE SEEN
YEAST WET PREP: NONE SEEN

## 2017-03-04 MED ORDER — HYDROCORTISONE ACE-PRAMOXINE 2.5-1 % RE CREA: 1.0000 "application " | TOPICAL_CREAM | Freq: Three times a day (TID) | RECTAL | 1 refills | Status: DC

## 2017-03-04 NOTE — Progress Notes (Signed)
Charlene Mitchell 08/05/1961 [id number]    History:   Presents for annual exam c/o hemorrhoids with discharge. Notices discharge from hemorrhoids when she urinates. Has not tried anything for relief.  2004 TAH with LSO for endometriosis on no HRT. Negative colonoscopy at age 56. Normal Pap and mammogram history. Had a mammogram this a.m. Last Dexa 02/2016-normal, - hip average. Hypertension and hypothyroidism managed by primary care. Sexually active/same partner greater than 10 years. Reports some  vaginal discharge, but denies bleeding and urinary symptoms. Reports new diagnosis of Hiatal Hernia, gastric ulcers, and kidney stones--GI managing. Walks on home treadmill 1-2x/week.    Past medical history, past surgical history, family history and social history were all reviewed and documented in the EPIC chart. 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  ROS:  A ROS was performed and pertinent positives and negatives are included.  Exam:  Vitals:   03/04/17 1451  BP: 124/78  Weight: 213 lb (96.6 kg)  Height: 5\' 3"  (1.6 m)   Body mass index is 37.73 kg/m.   General appearance:  Normal Thyroid:  Symmetrical, normal in size, without palpable masses or nodularity. Respiratory  Auscultation:  Clear without wheezing or rhonchi Cardiovascular  Auscultation:  Regular rate, without rubs, murmurs or gallops  Edema/varicosities:  Not grossly evident Abdominal  Soft,nontender, without masses, guarding or rebound.  Liver/spleen:  No organomegaly noted  Hernia:  None appreciated  Skin  Inspection:  Grossly normal   Breasts: Examined lying and sitting.     Right: Without masses, retractions, discharge or axillary adenopathy.     Left: Without masses, retractions, discharge or axillary adenopathy. Gentitourinary   Inguinal/mons:  Normal without inguinal adenopathy  External genitalia:  Normal  BUS/Urethra/Skene's glands:  Normal  Vagina:  Normal  Cervix:  And uterus absent  Adnexa/parametria:      Rt: Without masses or tenderness.   Lt: Without masses or tenderness.  Anus and perineum: Normal  Digital rectal exam: Normal sphincter tone without palpated masses or tenderness  Wet prep: negative  Assessment/Plan:  56 y.o.  S BF G0P0 for annual exam .    TAH with LSO for endometriosis on no HRT Hiatal Hernia/Gastric ulcer/Nephrolithiasis-GI managing Hypertension/Hypothyroidism--PCP managing labs and meds BMI > 35 Hemorrhoids   Plan: SBE's, continue annual 3-D screening mammogram/pendulous breasts. Continue healthy diet and exercise process of losing weight. Recommended and reviewed benefits of DASH diet. Importance of weightbearing exercise reviewed. Vitamin D over-the-counter 2000 IUs daily. Safety, fall prevention discussed. Analpram HC 2.5-1% rectal cream to be applied 3x/daily to affected area as needed. Instructed to follow-up if no relief.       Harrington Challengerancy J Latresa Gasser [facility] CenterWHNP, 3:41 PM 03/04/2017

## 2017-03-04 NOTE — Patient Instructions (Addendum)
DASH Eating Plan DASH stands for "Dietary Approaches to Stop Hypertension." The DASH eating plan is a healthy eating plan that has been shown to reduce high blood pressure (hypertension). It may also reduce your risk for type 2 diabetes, heart disease, and stroke. The DASH eating plan may also help with weight loss. What are tips for following this plan? General guidelines  Avoid eating more than 2,300 mg (milligrams) of salt (sodium) a day. If you have hypertension, you may need to reduce your sodium intake to 1,500 mg a day.  Limit alcohol intake to no more than 1 drink a day for nonpregnant women and 2 drinks a day for men. One drink equals 12 oz of beer, 5 oz of wine, or 1 oz of hard liquor.  Work with your health care provider to maintain a healthy body weight or to lose weight. Ask what an ideal weight is for you.  Get at least 30 minutes of exercise that causes your heart to beat faster (aerobic exercise) most days of the week. Activities may include walking, swimming, or biking.  Work with your health care provider or diet and nutrition specialist (dietitian) to adjust your eating plan to your individual calorie needs. Reading food labels  Check food labels for the amount of sodium per serving. Choose foods with less than 5 percent of the Daily Value of sodium. Generally, foods with less than 300 mg of sodium per serving fit into this eating plan.  To find whole grains, look for the word "whole" as the first word in the ingredient list. Shopping  Buy products labeled as "low-sodium" or "no salt added."  Buy fresh foods. Avoid canned foods and premade or frozen meals. Cooking  Avoid adding salt when cooking. Use salt-free seasonings or herbs instead of table salt or sea salt. Check with your health care provider or pharmacist before using salt substitutes.  Do not fry foods. Cook foods using healthy methods such as baking, boiling, grilling, and broiling instead.  Cook with  heart-healthy oils, such as olive, canola, soybean, or sunflower oil. Meal planning   Eat a balanced diet that includes: ? 5 or more servings of fruits and vegetables each day. At each meal, try to fill half of your plate with fruits and vegetables. ? Up to 6-8 servings of whole grains each day. ? Less than 6 oz of lean meat, poultry, or fish each day. A 3-oz serving of meat is about the same size as a deck of cards. One egg equals 1 oz. ? 2 servings of low-fat dairy each day. ? A serving of nuts, seeds, or beans 5 times each week. ? Heart-healthy fats. Healthy fats called Omega-3 fatty acids are found in foods such as flaxseeds and coldwater fish, like sardines, salmon, and mackerel.  Limit how much you eat of the following: ? Canned or prepackaged foods. ? Food that is high in trans fat, such as fried foods. ? Food that is high in saturated fat, such as fatty meat. ? Sweets, desserts, sugary drinks, and other foods with added sugar. ? Full-fat dairy products.  Do not salt foods before eating.  Try to eat at least 2 vegetarian meals each week.  Eat more home-cooked food and less restaurant, buffet, and fast food.  When eating at a restaurant, ask that your food be prepared with less salt or no salt, if possible. What foods are recommended? The items listed may not be a complete list. Talk with your dietitian about what   dietary choices are best for you. Grains Whole-grain or whole-wheat bread. Whole-grain or whole-wheat pasta. Brown rice. Oatmeal. Quinoa. Bulgur. Whole-grain and low-sodium cereals. Pita bread. Low-fat, low-sodium crackers. Whole-wheat flour tortillas. Vegetables Fresh or frozen vegetables (raw, steamed, roasted, or grilled). Low-sodium or reduced-sodium tomato and vegetable juice. Low-sodium or reduced-sodium tomato sauce and tomato paste. Low-sodium or reduced-sodium canned vegetables. Fruits All fresh, dried, or frozen fruit. Canned fruit in natural juice (without  added sugar). Meat and other protein foods Skinless chicken or turkey. Ground chicken or turkey. Pork with fat trimmed off. Fish and seafood. Egg whites. Dried beans, peas, or lentils. Unsalted nuts, nut butters, and seeds. Unsalted canned beans. Lean cuts of beef with fat trimmed off. Low-sodium, lean deli meat. Dairy Low-fat (1%) or fat-free (skim) milk. Fat-free, low-fat, or reduced-fat cheeses. Nonfat, low-sodium ricotta or cottage cheese. Low-fat or nonfat yogurt. Low-fat, low-sodium cheese. Fats and oils Soft margarine without trans fats. Vegetable oil. Low-fat, reduced-fat, or light mayonnaise and salad dressings (reduced-sodium). Canola, safflower, olive, soybean, and sunflower oils. Avocado. Seasoning and other foods Herbs. Spices. Seasoning mixes without salt. Unsalted popcorn and pretzels. Fat-free sweets. What foods are not recommended? The items listed may not be a complete list. Talk with your dietitian about what dietary choices are best for you. Grains Baked goods made with fat, such as croissants, muffins, or some breads. Dry pasta or rice meal packs. Vegetables Creamed or fried vegetables. Vegetables in a cheese sauce. Regular canned vegetables (not low-sodium or reduced-sodium). Regular canned tomato sauce and paste (not low-sodium or reduced-sodium). Regular tomato and vegetable juice (not low-sodium or reduced-sodium). Pickles. Olives. Fruits Canned fruit in a light or heavy syrup. Fried fruit. Fruit in cream or butter sauce. Meat and other protein foods Fatty cuts of meat. Ribs. Fried meat. Bacon. Sausage. Bologna and other processed lunch meats. Salami. Fatback. Hotdogs. Bratwurst. Salted nuts and seeds. Canned beans with added salt. Canned or smoked fish. Whole eggs or egg yolks. Chicken or turkey with skin. Dairy Whole or 2% milk, cream, and half-and-half. Whole or full-fat cream cheese. Whole-fat or sweetened yogurt. Full-fat cheese. Nondairy creamers. Whipped toppings.  Processed cheese and cheese spreads. Fats and oils Butter. Stick margarine. Lard. Shortening. Ghee. Bacon fat. Tropical oils, such as coconut, palm kernel, or palm oil. Seasoning and other foods Salted popcorn and pretzels. Onion salt, garlic salt, seasoned salt, table salt, and sea salt. Worcestershire sauce. Tartar sauce. Barbecue sauce. Teriyaki sauce. Soy sauce, including reduced-sodium. Steak sauce. Canned and packaged gravies. Fish sauce. Oyster sauce. Cocktail sauce. Horseradish that you find on the shelf. Ketchup. Mustard. Meat flavorings and tenderizers. Bouillon cubes. Hot sauce and Tabasco sauce. Premade or packaged marinades. Premade or packaged taco seasonings. Relishes. Regular salad dressings. Where to find more information:  National Heart, Lung, and Blood Institute: www.nhlbi.nih.gov  American Heart Association: www.heart.org Summary  The DASH eating plan is a healthy eating plan that has been shown to reduce high blood pressure (hypertension). It may also reduce your risk for type 2 diabetes, heart disease, and stroke.  With the DASH eating plan, you should limit salt (sodium) intake to 2,300 mg a day. If you have hypertension, you may need to reduce your sodium intake to 1,500 mg a day.  When on the DASH eating plan, aim to eat more fresh fruits and vegetables, whole grains, lean proteins, low-fat dairy, and heart-healthy fats.  Work with your health care provider or diet and nutrition specialist (dietitian) to adjust your eating plan to your individual   calorie needs. This information is not intended to replace advice given to you by your health care provider. Make sure you discuss any questions you have with your health care provider. Document Released: 08/30/2011 Document Revised: 09/03/2016 Document Reviewed: 09/03/2016 Elsevier Interactive Patient Education  2017 [employer]. Carbohydrate Counting for Diabetes Mellitus, Adult Carbohydrate counting is a method for  keeping track of how many carbohydrates you eat. Eating carbohydrates naturally increases the amount of sugar (glucose) in the blood. Counting how many carbohydrates you eat helps keep your blood glucose within normal limits, which helps you manage your diabetes (diabetes mellitus). It is important to know how many carbohydrates you can safely have in each meal. This is different for every person. A diet and nutrition specialist (registered dietitian) can help you make a meal plan and calculate how many carbohydrates you should have at each meal and snack. Carbohydrates are found in the following foods:  Grains, such as breads and cereals.  Dried beans and soy products.  Starchy vegetables, such as potatoes, peas, and corn.  Fruit and fruit juices.  Milk and yogurt.  Sweets and snack foods, such as cake, cookies, candy, chips, and soft drinks.  How do I count carbohydrates? There are two ways to count carbohydrates in food. You can use either of the methods or a combination of both. Reading "Nutrition Facts" on packaged food The "Nutrition Facts" list is included on the labels of almost all packaged foods and beverages in the [place] It includes:  The serving size.  Information about nutrients in each serving, including the grams (g) of carbohydrate per serving.  To use the "Nutrition Facts":  Decide how many servings you will have.  Multiply the number of servings by the number of carbohydrates per serving.  The resulting number is the total amount of carbohydrates that you will be having.  Learning standard serving sizes of other foods When you eat foods containing carbohydrates that are not packaged or do not include "Nutrition Facts" on the label, you need to measure the servings in order to count the amount of carbohydrates:  Measure the foods that you will eat with a food scale or measuring cup, if needed.  Decide how many standard-size servings you will eat.  Multiply the  number of servings by 15. Most carbohydrate-rich foods have about 15 g of carbohydrates per serving. ? For example, if you eat 8 oz (170 g) of strawberries, you will have eaten 2 servings and 30 g of carbohydrates (2 servings x 15 g = 30 g).  For foods that have more than one food mixed, such as soups and casseroles, you must count the carbohydrates in each food that is included.  The following list contains standard serving sizes of common carbohydrate-rich foods. Each of these servings has about 15 g of carbohydrates:   hamburger bun or  English muffin.   oz (15 mL) syrup.   oz (14 g) jelly.  1 slice of bread.  1 six-inch tortilla.  3 oz (85 g) cooked rice or pasta.  4 oz (113 g) cooked dried beans.  4 oz (113 g) starchy vegetable, such as peas, corn, or potatoes.  4 oz (113 g) hot cereal.  4 oz (113 g) mashed potatoes or  of a large baked potato.  4 oz (113 g) canned or frozen fruit.  4 oz (120 mL) fruit juice.  4-6 crackers.  6 chicken nuggets.  6 oz (170 g) unsweetened dry cereal.  6 oz (170 g)   plain fat-free yogurt or yogurt sweetened with artificial sweeteners.  8 oz (240 mL) milk.  8 oz (170 g) fresh fruit or one small piece of fruit.  24 oz (680 g) popped popcorn.  Example of carbohydrate counting Sample meal  3 oz (85 g) chicken breast.  6 oz (170 g) brown rice.  4 oz (113 g) corn.  8 oz (240 mL) milk.  8 oz (170 g) strawberries with sugar-free whipped topping. Carbohydrate calculation 1. Identify the foods that contain carbohydrates: ? Rice. ? Corn. ? Milk. ? Strawberries. 2. Calculate how many servings you have of each food: ? 2 servings rice. ? 1 serving corn. ? 1 serving milk. ? 1 serving strawberries. 3. Multiply each number of servings by 15 g: ? 2 servings rice x 15 g = 30 g. ? 1 serving corn x 15 g = 15 g. ? 1 serving milk x 15 g = 15 g. ? 1 serving strawberries x 15 g = 15 g. 4. Add together all of the amounts to find  the total grams of carbohydrates eaten: ? 30 g + 15 g + 15 g + 15 g = 75 g of carbohydrates total. This information is not intended to replace advice given to you by your health care provider. Make sure you discuss any questions you have with your health care provider. Document Released: 09/10/2005 Document Revised: 03/30/2016 Document Reviewed: 02/22/2016 Elsevier Interactive Patient Education  2018 Elsevier Inc.  

## 2017-08-01 ENCOUNTER — Other Ambulatory Visit: Payer: Self-pay | Admitting: Internal Medicine

## 2017-08-01 DIAGNOSIS — I159 Secondary hypertension, unspecified: Secondary | ICD-10-CM

## 2017-08-09 ENCOUNTER — Ambulatory Visit (HOSPITAL_COMMUNITY)
Admission: RE | Admit: 2017-08-09 | Discharge: 2017-08-09 | Disposition: A | Discharge status: Home / Self Care | Payer: 59 | Source: Ambulatory Visit | Attending: Internal Medicine | Admitting: Internal Medicine

## 2017-08-09 DIAGNOSIS — R002 Palpitations: Secondary | ICD-10-CM | POA: Diagnosis not present

## 2017-08-09 DIAGNOSIS — I159 Secondary hypertension, unspecified: Secondary | ICD-10-CM | POA: Diagnosis not present

## 2017-08-09 NOTE — Progress Notes (Signed)
  Echocardiogram 2D Echocardiogram has been performed.  Charlene Mitchell, Charlene Mitchell 08/09/2017, 4:09 PM

## 2017-11-23 IMAGING — CT CT ABD-PELV W/ CM
2 of 5 series · 15 of 46 positions shown, 17 images · IV contrast (APPLIED)
Comparison: None.

CLINICAL DATA: Reflux with hiatal hernia and mid epigastric pain
for several months.

EXAM:
CT ABDOMEN AND PELVIS WITH CONTRAST
TECHNIQUE: Multidetector CT imaging of the abdomen and pelvis was performed
using the standard protocol following bolus administration of
intravenous contrast.
CONTRAST:  100mL 5VA4UZ-WRR IOPAMIDOL (5VA4UZ-WRR) INJECTION 61%

[Series 2: abd/pelvis w/cm · axial · 0.85mm/px · z∈[-702,-267]mm · 12 of 99 slices shown, 14 images]
[im 6/99  soft-tissue]
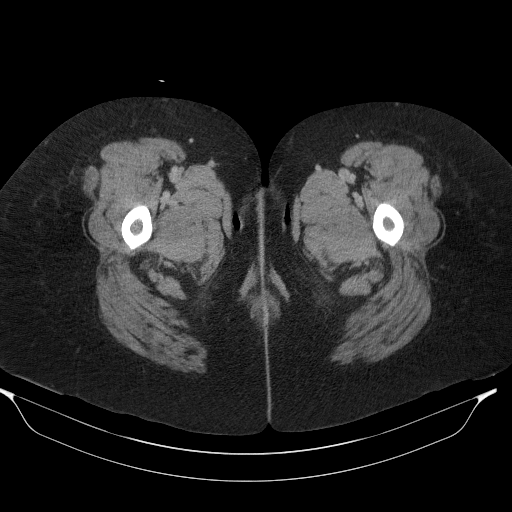
[im 6/99  bone]
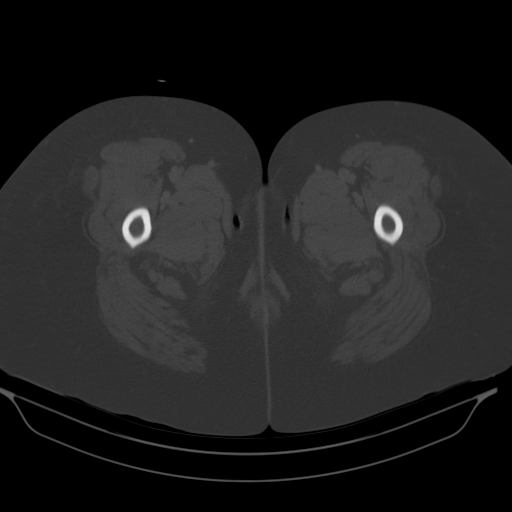
[im 18/99  soft-tissue]
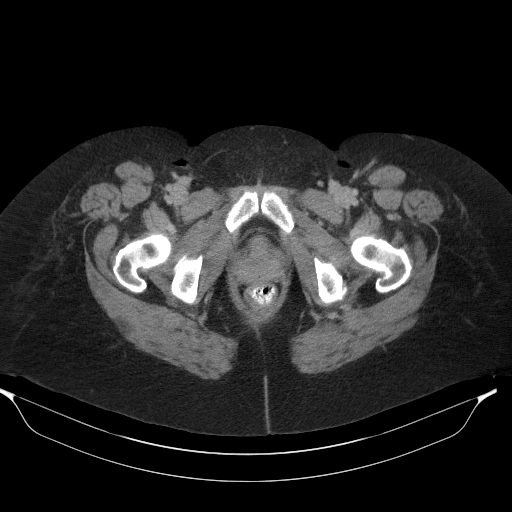
[im 24/99  soft-tissue]
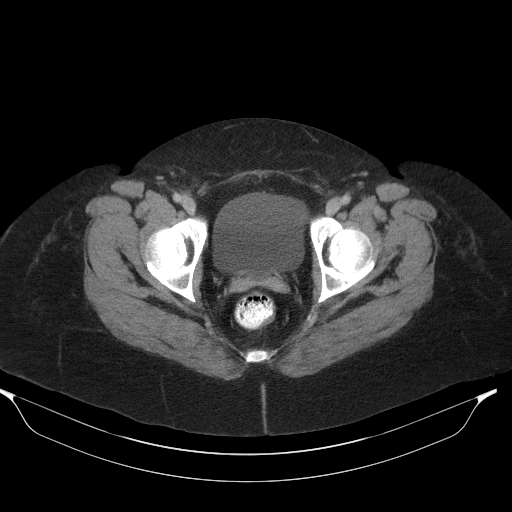
[im 29/99  soft-tissue]
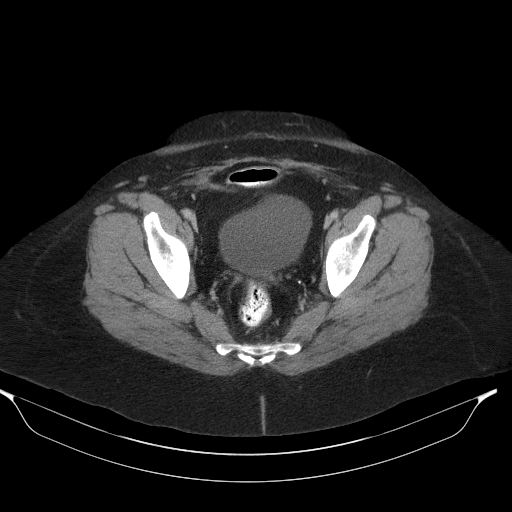
[im 41/99  soft-tissue]
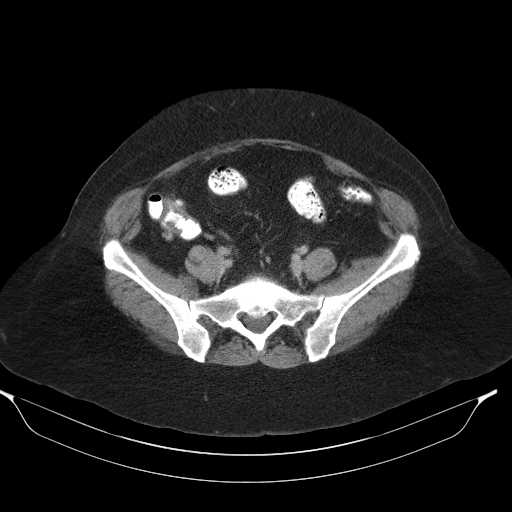
[im 47/99  soft-tissue]
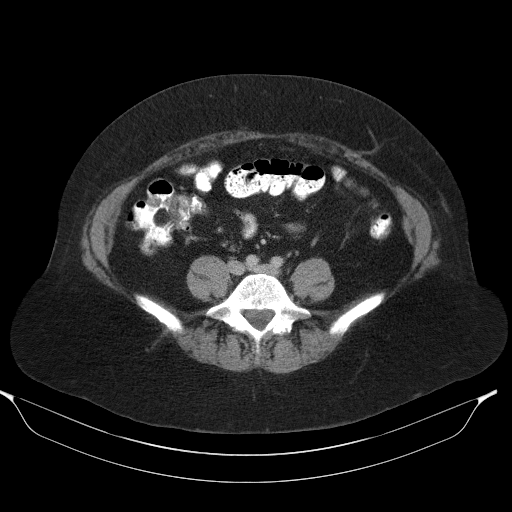
[im 52/99  soft-tissue]
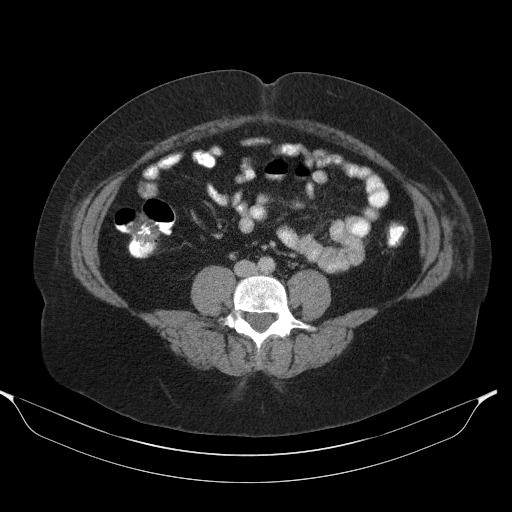
[im 64/99  soft-tissue]
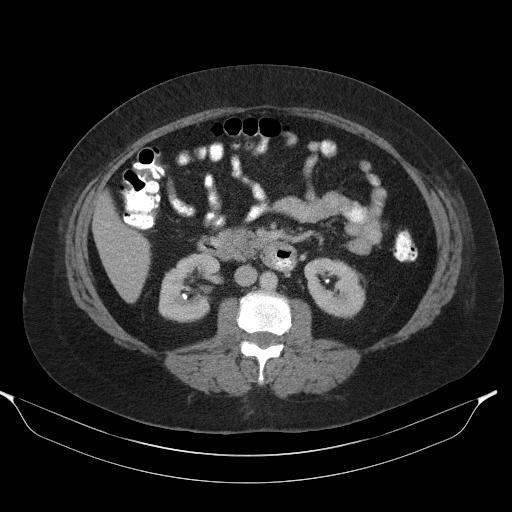
[im 70/99  soft-tissue]
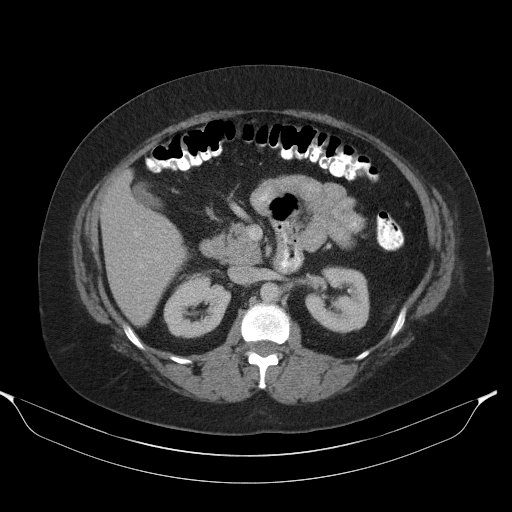
[im 70/99  bone]
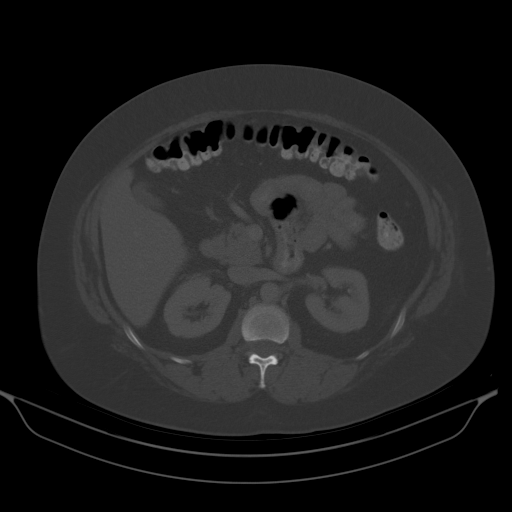
[im 75/99  soft-tissue]
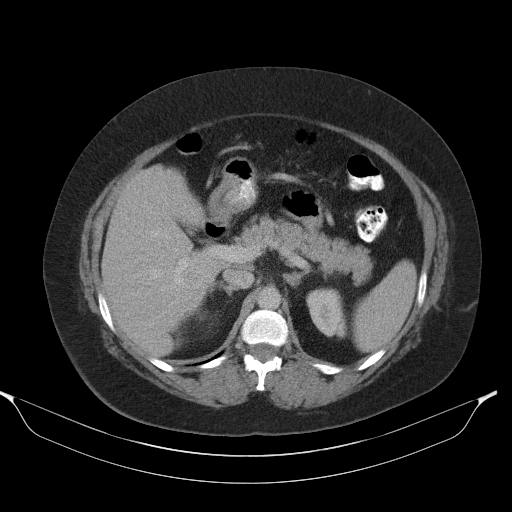
[im 87/99  soft-tissue]
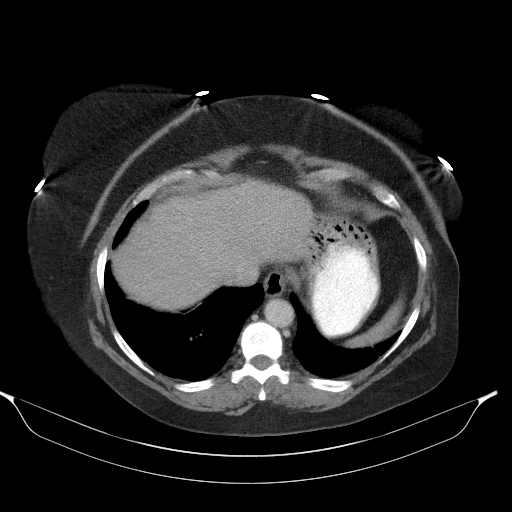
[im 93/99  soft-tissue]
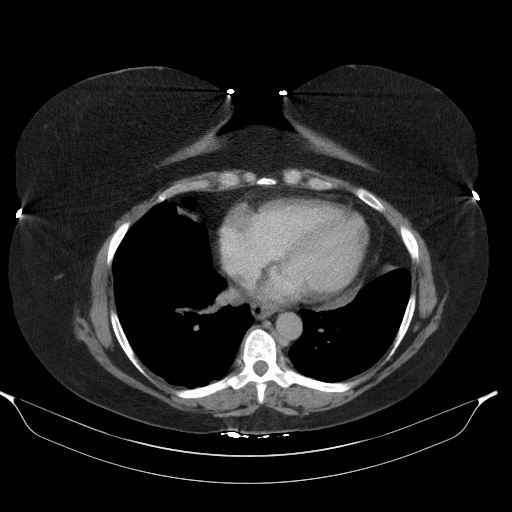

[Series 3: cor · coronal · 0.79mm/px · 3 of 97 slices shown]
[im 33/97  soft-tissue]
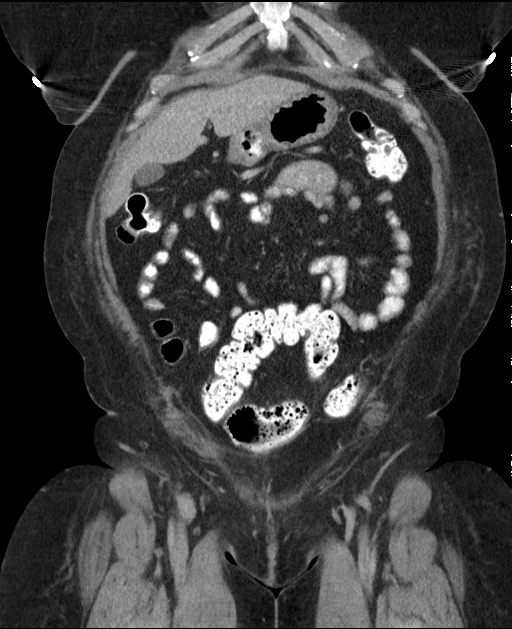
[im 43/97  soft-tissue]
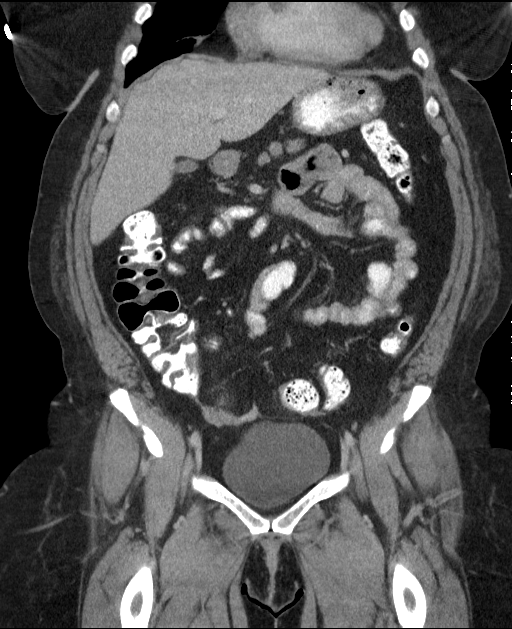
[im 54/97  soft-tissue]
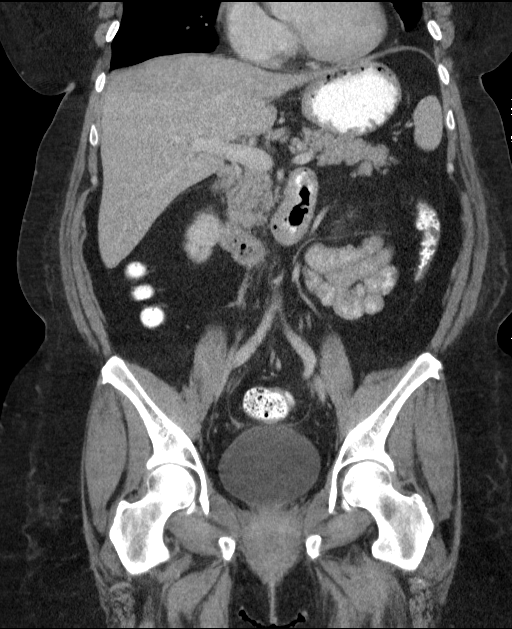

[15 of 46 positions shown; findings below may reference images not displayed]

FINDINGS: Lower chest:  Subsegmental atelectasis noted both lung bases.

Hepatobiliary: No focal abnormality within the liver parenchyma.
Subcapsular focal fatty deposition noted posterior aspect medial
segment left liver. There is no evidence for gallstones, gallbladder
wall thickening, or pericholecystic fluid. No intrahepatic or
extrahepatic biliary dilation.

Pancreas: No focal mass lesion. No dilatation of the main duct. No
intraparenchymal cyst. No peripancreatic edema.

Spleen: No splenomegaly. No focal mass lesion.

Adrenals/Urinary Tract: No adrenal nodule or mass. 3 x 5 mm
nonobstructing stone identified lower pole right kidney. Multiple
stones identified left kidney without hydronephrosis. Dominant stone
is a 6 x 6 mm upper pole stone with a 2 mm interpolar stone
identified and adjacent 4 mm stones in the lower pole. No ureteral
stones. No secondary changes in either kidney or ureter. The urinary
bladder appears normal for the degree of distention.

Stomach/Bowel: Stomach is nondistended. No gastric wall thickening.
No evidence of outlet obstruction. Peristalsis noted antral region.
Duodenum is normally positioned as is the ligament of Treitz. No
small bowel wall thickening. No small bowel dilatation. The terminal
ileum is normal. The appendix is normal. No gross colonic mass. No
colonic wall thickening. No substantial diverticular change.

Vascular/Lymphatic: No abdominal aortic aneurysm. No abdominal
aortic atherosclerotic calcification. There is no gastrohepatic or
hepatoduodenal ligament lymphadenopathy. No intraperitoneal or
retroperitoneal lymphadenopathy. No pelvic sidewall lymphadenopathy.

Reproductive: Uterus surgically absent. There is no adnexal mass.
Dominant follicle noted right ovary.

Other: No intraperitoneal free fluid.

Musculoskeletal: 6 mm sclerotic focus posterior left iliac bone and
right acetabular roof likely bone islands. Question early changes of
avascular necrosis right femoral head (see image 53 coronal series 3
and image 36 sagittal series 4).
IMPRESSION: 1. No acute findings in the abdomen or pelvis. No evidence to
explain the patient's history of mid epigastric pain.
2. Bilateral nonobstructing renal stones.
3. Apparent early changes of avascular necrosis right femoral head.
MRI could be definitive, as clinically warranted.

## 2018-03-05 ENCOUNTER — Encounter: Payer: Self-pay | Admitting: Women's Health

## 2018-03-05 ENCOUNTER — Ambulatory Visit: Payer: 59 | Admitting: Women's Health

## 2018-03-05 VITALS — BP 118/80 | Ht 63.0 in | Wt 213.0 lb

## 2018-03-05 DIAGNOSIS — Z01419 Encounter for gynecological examination (general) (routine) without abnormal findings: Secondary | ICD-10-CM

## 2018-03-05 NOTE — Patient Instructions (Signed)
[phone]  Diabetes prevention program Health Maintenance for Postmenopausal Women Menopause is a normal process in which your reproductive ability comes to an end. This process happens gradually over a span of months to years, usually between the ages of 17 and 62. Menopause is complete when you have missed 12 consecutive menstrual periods. It is important to talk with your health care provider about some of the most common conditions that affect postmenopausal women, such as heart disease, cancer, and bone loss (osteoporosis). Adopting a healthy lifestyle and getting preventive care can help to promote your health and wellness. Those actions can also lower your chances of developing some of these common conditions. What should I know about menopause? During menopause, you may experience a number of symptoms, such as:  Moderate-to-severe hot flashes.  Night sweats.  Decrease in sex drive.  Mood swings.  Headaches.  Tiredness.  Irritability.  Memory problems.  Insomnia.  Choosing to treat or not to treat menopausal changes is an individual decision that you make with your health care provider. What should I know about hormone replacement therapy and supplements? Hormone therapy products are effective for treating symptoms that are associated with menopause, such as hot flashes and night sweats. Hormone replacement carries certain risks, especially as you become older. If you are thinking about using estrogen or estrogen with progestin treatments, discuss the benefits and risks with your health care provider. What should I know about heart disease and stroke? Heart disease, heart attack, and stroke become more likely as you age. This may be due, in part, to the hormonal changes that your body experiences during menopause. These can affect how your body processes dietary fats, triglycerides, and cholesterol. Heart attack and stroke are both medical emergencies. There are many things  that you can do to help prevent heart disease and stroke:  Have your blood pressure checked at least every 1-2 years. High blood pressure causes heart disease and increases the risk of stroke.  If you are 72-20 years old, ask your health care provider if you should take aspirin to prevent a heart attack or a stroke.  Do not use any tobacco products, including cigarettes, chewing tobacco, or electronic cigarettes. If you need help quitting, ask your health care provider.  It is important to eat a healthy diet and maintain a healthy weight. ? Be sure to include plenty of vegetables, fruits, low-fat dairy products, and lean protein. ? Avoid eating foods that are high in solid fats, added sugars, or salt (sodium).  Get regular exercise. This is one of the most important things that you can do for your health. ? Try to exercise for at least 150 minutes each week. The type of exercise that you do should increase your heart rate and make you sweat. This is known as moderate-intensity exercise. ? Try to do strengthening exercises at least twice each week. Do these in addition to the moderate-intensity exercise.  Know your numbers.Ask your health care provider to check your cholesterol and your blood glucose. Continue to have your blood tested as directed by your health care provider.  What should I know about cancer screening? There are several types of cancer. Take the following steps to reduce your risk and to catch any cancer development as early as possible. Breast Cancer  Practice breast self-awareness. ? This means understanding how your breasts normally appear and feel. ? It also means doing regular breast self-exams. Let your health care provider know about any changes, no matter how small.  If you are 30 or older, have a clinician do a breast exam (clinical breast exam or CBE) every year. Depending on your age, family history, and medical history, it may be recommended that you also have  a yearly breast X-ray (mammogram).  If you have a family history of breast cancer, talk with your health care provider about genetic screening.  If you are at high risk for breast cancer, talk with your health care provider about having an MRI and a mammogram every year.  Breast cancer (BRCA) gene test is recommended for women who have family members with BRCA-related cancers. Results of the assessment will determine the need for genetic counseling and BRCA1 and for BRCA2 testing. BRCA-related cancers include these types: ? Breast. This occurs in males or females. ? Ovarian. ? Tubal. This may also be called fallopian tube cancer. ? Cancer of the abdominal or pelvic lining (peritoneal cancer). ? Prostate. ? Pancreatic.  Cervical, Uterine, and Ovarian Cancer Your health care provider may recommend that you be screened regularly for cancer of the pelvic organs. These include your ovaries, uterus, and vagina. This screening involves a pelvic exam, which includes checking for microscopic changes to the surface of your cervix (Pap test).  For women ages 21-65, health care providers may recommend a pelvic exam and a Pap test every three years. For women ages 13-65, they may recommend the Pap test and pelvic exam, combined with testing for human papilloma virus (HPV), every five years. Some types of HPV increase your risk of cervical cancer. Testing for HPV may also be done on women of any age who have unclear Pap test results.  Other health care providers may not recommend any screening for nonpregnant women who are considered low risk for pelvic cancer and have no symptoms. Ask your health care provider if a screening pelvic exam is right for you.  If you have had past treatment for cervical cancer or a condition that could lead to cancer, you need Pap tests and screening for cancer for at least 20 years after your treatment. If Pap tests have been discontinued for you, your risk factors (such as  having a new sexual partner) need to be reassessed to determine if you should start having screenings again. Some women have medical problems that increase the chance of getting cervical cancer. In these cases, your health care provider may recommend that you have screening and Pap tests more often.  If you have a family history of uterine cancer or ovarian cancer, talk with your health care provider about genetic screening.  If you have vaginal bleeding after reaching menopause, tell your health care provider.  There are currently no reliable tests available to screen for ovarian cancer.  Lung Cancer Lung cancer screening is recommended for adults 64-57 years old who are at high risk for lung cancer because of a history of smoking. A yearly low-dose CT scan of the lungs is recommended if you:  Currently smoke.  Have a history of at least 30 pack-years of smoking and you currently smoke or have quit within the past 15 years. A pack-year is smoking an average of one pack of cigarettes per day for one year.  Yearly screening should:  Continue until it has been 15 years since you quit.  Stop if you develop a health problem that would prevent you from having lung cancer treatment.  Colorectal Cancer  This type of cancer can be detected and can often be prevented.  Routine colorectal cancer  screening usually begins at age 84 and continues through age 7.  If you have risk factors for colon cancer, your health care provider may recommend that you be screened at an earlier age.  If you have a family history of colorectal cancer, talk with your health care provider about genetic screening.  Your health care provider may also recommend using home test kits to check for hidden blood in your stool.  A small camera at the end of a tube can be used to examine your colon directly (sigmoidoscopy or colonoscopy). This is done to check for the earliest forms of colorectal cancer.  Direct  examination of the colon should be repeated every 5-10 years until age 40. However, if early forms of precancerous polyps or small growths are found or if you have a family history or genetic risk for colorectal cancer, you may need to be screened more often.  Skin Cancer  Check your skin from head to toe regularly.  Monitor any moles. Be sure to tell your health care provider: ? About any new moles or changes in moles, especially if there is a change in a mole's shape or color. ? If you have a mole that is larger than the size of a pencil eraser.  If any of your family members has a history of skin cancer, especially at a young age, talk with your health care provider about genetic screening.  Always use sunscreen. Apply sunscreen liberally and repeatedly throughout the day.  Whenever you are outside, protect yourself by wearing long sleeves, pants, a wide-brimmed hat, and sunglasses.  What should I know about osteoporosis? Osteoporosis is a condition in which bone destruction happens more quickly than new bone creation. After menopause, you may be at an increased risk for osteoporosis. To help prevent osteoporosis or the bone fractures that can happen because of osteoporosis, the following is recommended:  If you are 40-59 years old, get at least 1,000 mg of calcium and at least 600 mg of vitamin D per day.  If you are older than age 58 but younger than age 20, get at least 1,200 mg of calcium and at least 600 mg of vitamin D per day.  If you are older than age 21, get at least 1,200 mg of calcium and at least 800 mg of vitamin D per day.  Smoking and excessive alcohol intake increase the risk of osteoporosis. Eat foods that are rich in calcium and vitamin D, and do weight-bearing exercises several times each week as directed by your health care provider. What should I know about how menopause affects my mental health? Depression may occur at any age, but it is more common as you become  older. Common symptoms of depression include:  Low or sad mood.  Changes in sleep patterns.  Changes in appetite or eating patterns.  Feeling an overall lack of motivation or enjoyment of activities that you previously enjoyed.  Frequent crying spells.  Talk with your health care provider if you think that you are experiencing depression. What should I know about immunizations? It is important that you get and maintain your immunizations. These include:  Tetanus, diphtheria, and pertussis (Tdap) booster vaccine.  Influenza every year before the flu season begins.  Pneumonia vaccine.  Shingles vaccine.  Your health care provider may also recommend other immunizations. This information is not intended to replace advice given to you by your health care provider. Make sure you discuss any questions you have with your health care  provider. Document Released: 11/02/2005 Document Revised: 03/30/2016 Document Reviewed: 06/14/2015 Elsevier Interactive Patient Education  2018 [employer].  Carbohydrate Counting for Diabetes Mellitus, Adult Carbohydrate counting is a method for keeping track of how many carbohydrates you eat. Eating carbohydrates naturally increases the amount of sugar (glucose) in the blood. Counting how many carbohydrates you eat helps keep your blood glucose within normal limits, which helps you manage your diabetes (diabetes mellitus). It is important to know how many carbohydrates you can safely have in each meal. This is different for every person. A diet and nutrition specialist (registered dietitian) can help you make a meal plan and calculate how many carbohydrates you should have at each meal and snack. Carbohydrates are found in the following foods:  Grains, such as breads and cereals.  Dried beans and soy products.  Starchy vegetables, such as potatoes, peas, and corn.  Fruit and fruit juices.  Milk and yogurt.  Sweets and snack foods, such as cake,  cookies, candy, chips, and soft drinks.  How do I count carbohydrates? There are two ways to count carbohydrates in food. You can use either of the methods or a combination of both. Reading "Nutrition Facts" on packaged food The "Nutrition Facts" list is included on the labels of almost all packaged foods and beverages in the [place] It includes:  The serving size.  Information about nutrients in each serving, including the grams (g) of carbohydrate per serving.  To use the "Nutrition Facts":  Decide how many servings you will have.  Multiply the number of servings by the number of carbohydrates per serving.  The resulting number is the total amount of carbohydrates that you will be having.  Learning standard serving sizes of other foods When you eat foods containing carbohydrates that are not packaged or do not include "Nutrition Facts" on the label, you need to measure the servings in order to count the amount of carbohydrates:  Measure the foods that you will eat with a food scale or measuring cup, if needed.  Decide how many standard-size servings you will eat.  Multiply the number of servings by 15. Most carbohydrate-rich foods have about 15 g of carbohydrates per serving. ? For example, if you eat 8 oz (170 g) of strawberries, you will have eaten 2 servings and 30 g of carbohydrates (2 servings x 15 g = 30 g).  For foods that have more than one food mixed, such as soups and casseroles, you must count the carbohydrates in each food that is included.  The following list contains standard serving sizes of common carbohydrate-rich foods. Each of these servings has about 15 g of carbohydrates:   hamburger bun or  English muffin.   oz (15 mL) syrup.   oz (14 g) jelly.  1 slice of bread.  1 six-inch tortilla.  3 oz (85 g) cooked rice or pasta.  4 oz (113 g) cooked dried beans.  4 oz (113 g) starchy vegetable, such as peas, corn, or potatoes.  4 oz (113 g) hot  cereal.  4 oz (113 g) mashed potatoes or  of a large baked potato.  4 oz (113 g) canned or frozen fruit.  4 oz (120 mL) fruit juice.  4-6 crackers.  6 chicken nuggets.  6 oz (170 g) unsweetened dry cereal.  6 oz (170 g) plain fat-free yogurt or yogurt sweetened with artificial sweeteners.  8 oz (240 mL) milk.  8 oz (170 g) fresh fruit or one small piece of fruit.  24 oz (680 g) popped popcorn.  Example of carbohydrate counting Sample meal  3 oz (85 g) chicken breast.  6 oz (170 g) brown rice.  4 oz (113 g) corn.  8 oz (240 mL) milk.  8 oz (170 g) strawberries with sugar-free whipped topping. Carbohydrate calculation 1. Identify the foods that contain carbohydrates: ? Rice. ? Corn. ? Milk. ? Strawberries. 2. Calculate how many servings you have of each food: ? 2 servings rice. ? 1 serving corn. ? 1 serving milk. ? 1 serving strawberries. 3. Multiply each number of servings by 15 g: ? 2 servings rice x 15 g = 30 g. ? 1 serving corn x 15 g = 15 g. ? 1 serving milk x 15 g = 15 g. ? 1 serving strawberries x 15 g = 15 g. 4. Add together all of the amounts to find the total grams of carbohydrates eaten: ? 30 g + 15 g + 15 g + 15 g = 75 g of carbohydrates total. This information is not intended to replace advice given to you by your health care provider. Make sure you discuss any questions you have with your health care provider. Document Released: 09/10/2005 Document Revised: 03/30/2016 Document Reviewed: 02/22/2016 Elsevier Interactive Patient Education  [employer].

## 2018-03-05 NOTE — Progress Notes (Signed)
Bethene Victorino DikeM Lindon 08/26/1961 [id number]    History:    Presents for annual exam.  2004 TAH with [facility] for endometriosis and fibroids on no HRT.  Normal Pap and mammogram history, had mammogram today.  2018- colonoscopy.  Primary care manages hypertension, hypothyroidism.  Had elevated hemoglobin A1c at primary care and has follow-up next month.  Same partner greater than 10 years.  Past medical history, past surgical history, family history and social history were all reviewed and documented in the EPIC chart.  Works in the [employer] department at the [place] with [employer].  Father bladder cancer.  ROS:  A ROS was performed and pertinent positives and negatives are included.  Exam:  Vitals:   03/05/18 1420  BP: 118/80  Weight: 213 lb (96.6 kg)  Height: 5\' 3"  (1.6 m)   Body mass index is 37.73 kg/m.   General appearance:  Normal Thyroid:  Symmetrical, normal in size, without palpable masses or nodularity. Respiratory  Auscultation:  Clear without wheezing or rhonchi Cardiovascular  Auscultation:  Regular rate, without rubs, murmurs or gallops  Edema/varicosities:  Not grossly evident Abdominal  Soft,nontender, without masses, guarding or rebound.  Liver/spleen:  No organomegaly noted  Hernia:  None appreciated  Skin  Inspection:  Grossly normal   Breasts: Examined lying and sitting.     Right: Without masses, retractions, discharge or axillary adenopathy.     Left: Without masses, retractions, discharge or axillary adenopathy. Gentitourinary   Inguinal/mons:  Normal without inguinal adenopathy  External genitalia:  Normal  BUS/Urethra/Skene's glands:  Normal  Vagina:  Normal  Cervix: And uterus absent adnexa/parametria:     Rt: Without masses or tenderness.   Lt: Without masses or tenderness.  Anus and perineum: Normal  Digital rectal exam: Normal sphincter tone without palpated masses or tenderness  Assessment/Plan:  57 y.o. SBF G0 for annual exam with no GYN  complaints.  2004 TAH with [facility] for endometriosis and fibroids no HRT Hypertension, hypothyroidism, diabetes-primary care manages labs and meds Obesity  Plan: Reviewed importance of increasing regular exercise, calcium rich foods, vitamin D 2000 daily.  Discussion concerning low carb/calorie diet, daily walking, importance of weight loss.  Keep scheduled follow-up with primary care for elevated hemoglobin A1c.  SBE's, continue annual screening mammogram.  Will check bone density next year.    Harrington Challengerancy J Daqwan Dougal [facility] Of Union CountyWHNP, 3:18 PM 03/05/2018

## 2018-03-06 LAB — URINALYSIS, COMPLETE W/RFL CULTURE
Bacteria, UA: NONE SEEN /HPF
Bilirubin Urine: NEGATIVE
Glucose, UA: NEGATIVE
Hgb urine dipstick: NEGATIVE
Hyaline Cast: NONE SEEN /LPF
Ketones, ur: NEGATIVE
Leukocyte Esterase: NEGATIVE
Nitrites, Initial: NEGATIVE
Protein, ur: NEGATIVE
RBC / HPF: NONE SEEN /HPF (ref 0–2)
Specific Gravity, Urine: 1.014 (ref 1.001–1.03)
Squamous Epithelial / LPF: NONE SEEN /HPF (ref <=5)
WBC, UA: NONE SEEN /HPF (ref 0–5)
pH: 5.5 (ref 5.0–8.0)

## 2018-03-06 LAB — NO CULTURE INDICATED

## 2019-03-05 ENCOUNTER — Other Ambulatory Visit: Payer: Self-pay

## 2019-03-09 ENCOUNTER — Other Ambulatory Visit: Payer: Self-pay

## 2019-03-09 ENCOUNTER — Encounter: Payer: Self-pay | Admitting: Women's Health

## 2019-03-09 ENCOUNTER — Ambulatory Visit: Payer: 59 | Admitting: Women's Health

## 2019-03-09 VITALS — BP 128/80 | Ht 63.0 in | Wt 199.0 lb

## 2019-03-09 DIAGNOSIS — Z01419 Encounter for gynecological examination (general) (routine) without abnormal findings: Secondary | ICD-10-CM | POA: Diagnosis not present

## 2019-03-09 DIAGNOSIS — E7800 Pure hypercholesterolemia, unspecified: Secondary | ICD-10-CM | POA: Insufficient documentation

## 2019-03-09 DIAGNOSIS — J45909 Unspecified asthma, uncomplicated: Secondary | ICD-10-CM | POA: Insufficient documentation

## 2019-03-09 NOTE — Patient Instructions (Signed)
Health Maintenance for Postmenopausal Women Menopause is a normal process in which your reproductive ability comes to an end. This process happens gradually over a span of months to years, usually between the ages of 62 and 89. Menopause is complete when you have missed 12 consecutive menstrual periods. It is important to talk with your health care provider about some of the most common conditions that affect postmenopausal women, such as heart disease, cancer, and bone loss (osteoporosis). Adopting a healthy lifestyle and getting preventive care can help to promote your health and wellness. Those actions can also lower your chances of developing some of these common conditions. What should I know about menopause? During menopause, you may experience a number of symptoms, such as:  Moderate-to-severe hot flashes.  Night sweats.  Decrease in sex drive.  Mood swings.  Headaches.  Tiredness.  Irritability.  Memory problems.  Insomnia. Choosing to treat or not to treat menopausal changes is an individual decision that you make with your health care provider. What should I know about hormone replacement therapy and supplements? Hormone therapy products are effective for treating symptoms that are associated with menopause, such as hot flashes and night sweats. Hormone replacement carries certain risks, especially as you become older. If you are thinking about using estrogen or estrogen with progestin treatments, discuss the benefits and risks with your health care provider. What should I know about heart disease and stroke? Heart disease, heart attack, and stroke become more likely as you age. This may be due, in part, to the hormonal changes that your body experiences during menopause. These can affect how your body processes dietary fats, triglycerides, and cholesterol. Heart attack and stroke are both medical emergencies. There are many things that you can do to help prevent heart disease  and stroke:  Have your blood pressure checked at least every 1-2 years. High blood pressure causes heart disease and increases the risk of stroke.  If you are 79-72 years old, ask your health care provider if you should take aspirin to prevent a heart attack or a stroke.  Do not use any tobacco products, including cigarettes, chewing tobacco, or electronic cigarettes. If you need help quitting, ask your health care provider.  It is important to eat a healthy diet and maintain a healthy weight. ? Be sure to include plenty of vegetables, fruits, low-fat dairy products, and lean protein. ? Avoid eating foods that are high in solid fats, added sugars, or salt (sodium).  Get regular exercise. This is one of the most important things that you can do for your health. ? Try to exercise for at least 150 minutes each week. The type of exercise that you do should increase your heart rate and make you sweat. This is known as moderate-intensity exercise. ? Try to do strengthening exercises at least twice each week. Do these in addition to the moderate-intensity exercise.  Know your numbers.Ask your health care provider to check your cholesterol and your blood glucose. Continue to have your blood tested as directed by your health care provider.  What should I know about cancer screening? There are several types of cancer. Take the following steps to reduce your risk and to catch any cancer development as early as possible. Breast Cancer  Practice breast self-awareness. ? This means understanding how your breasts normally appear and feel. ? It also means doing regular breast self-exams. Let your health care provider know about any changes, no matter how small.  If you are 40 or  older, have a clinician do a breast exam (clinical breast exam or CBE) every year. Depending on your age, family history, and medical history, it may be recommended that you also have a yearly breast X-ray (mammogram).  If you  have a family history of breast cancer, talk with your health care provider about genetic screening.  If you are at high risk for breast cancer, talk with your health care provider about having an MRI and a mammogram every year.  Breast cancer (BRCA) gene test is recommended for women who have family members with BRCA-related cancers. Results of the assessment will determine the need for genetic counseling and BRCA1 and for BRCA2 testing. BRCA-related cancers include these types: ? Breast. This occurs in males or females. ? Ovarian. ? Tubal. This may also be called fallopian tube cancer. ? Cancer of the abdominal or pelvic lining (peritoneal cancer). ? Prostate. ? Pancreatic. Cervical, Uterine, and Ovarian Cancer Your health care provider may recommend that you be screened regularly for cancer of the pelvic organs. These include your ovaries, uterus, and vagina. This screening involves a pelvic exam, which includes checking for microscopic changes to the surface of your cervix (Pap test).  For women ages 21-65, health care providers may recommend a pelvic exam and a Pap test every three years. For women ages 39-65, they may recommend the Pap test and pelvic exam, combined with testing for human papilloma virus (HPV), every five years. Some types of HPV increase your risk of cervical cancer. Testing for HPV may also be done on women of any age who have unclear Pap test results.  Other health care providers may not recommend any screening for nonpregnant women who are considered low risk for pelvic cancer and have no symptoms. Ask your health care provider if a screening pelvic exam is right for you.  If you have had past treatment for cervical cancer or a condition that could lead to cancer, you need Pap tests and screening for cancer for at least 20 years after your treatment. If Pap tests have been discontinued for you, your risk factors (such as having a new sexual partner) need to be reassessed  to determine if you should start having screenings again. Some women have medical problems that increase the chance of getting cervical cancer. In these cases, your health care provider may recommend that you have screening and Pap tests more often.  If you have a family history of uterine cancer or ovarian cancer, talk with your health care provider about genetic screening.  If you have vaginal bleeding after reaching menopause, tell your health care provider.  There are currently no reliable tests available to screen for ovarian cancer. Lung Cancer Lung cancer screening is recommended for adults 57-50 years old who are at high risk for lung cancer because of a history of smoking. A yearly low-dose CT scan of the lungs is recommended if you:  Currently smoke.  Have a history of at least 30 pack-years of smoking and you currently smoke or have quit within the past 15 years. A pack-year is smoking an average of one pack of cigarettes per day for one year. Yearly screening should:  Continue until it has been 15 years since you quit.  Stop if you develop a health problem that would prevent you from having lung cancer treatment. Colorectal Cancer  This type of cancer can be detected and can often be prevented.  Routine colorectal cancer screening usually begins at age 12 and continues through  age 63.  If you have risk factors for colon cancer, your health care provider may recommend that you be screened at an earlier age.  If you have a family history of colorectal cancer, talk with your health care provider about genetic screening.  Your health care provider may also recommend using home test kits to check for hidden blood in your stool.  A small camera at the end of a tube can be used to examine your colon directly (sigmoidoscopy or colonoscopy). This is done to check for the earliest forms of colorectal cancer.  Direct examination of the colon should be repeated every 5-10 years until  age 75. However, if early forms of precancerous polyps or small growths are found or if you have a family history or genetic risk for colorectal cancer, you may need to be screened more often. Skin Cancer  Check your skin from head to toe regularly.  Monitor any moles. Be sure to tell your health care provider: ? About any new moles or changes in moles, especially if there is a change in a mole's shape or color. ? If you have a mole that is larger than the size of a pencil eraser.  If any of your family members has a history of skin cancer, especially at a Janaiyah Blackard age, talk with your health care provider about genetic screening.  Always use sunscreen. Apply sunscreen liberally and repeatedly throughout the day.  Whenever you are outside, protect yourself by wearing long sleeves, pants, a wide-brimmed hat, and sunglasses. What should I know about osteoporosis? Osteoporosis is a condition in which bone destruction happens more quickly than new bone creation. After menopause, you may be at an increased risk for osteoporosis. To help prevent osteoporosis or the bone fractures that can happen because of osteoporosis, the following is recommended:  If you are 59-59 years old, get at least 1,000 mg of calcium and at least 600 mg of vitamin D per day.  If you are older than age 36 but younger than age 32, get at least 1,200 mg of calcium and at least 600 mg of vitamin D per day.  If you are older than age 47, get at least 1,200 mg of calcium and at least 800 mg of vitamin D per day. Smoking and excessive alcohol intake increase the risk of osteoporosis. Eat foods that are rich in calcium and vitamin D, and do weight-bearing exercises several times each week as directed by your health care provider. What should I know about how menopause affects my mental health? Depression may occur at any age, but it is more common as you become older. Common symptoms of depression include:  Low or sad mood.   Changes in sleep patterns.  Changes in appetite or eating patterns.  Feeling an overall lack of motivation or enjoyment of activities that you previously enjoyed.  Frequent crying spells. Talk with your health care provider if you think that you are experiencing depression. What should I know about immunizations? It is important that you get and maintain your immunizations. These include:  Tetanus, diphtheria, and pertussis (Tdap) booster vaccine.  Influenza every year before the flu season begins.  Pneumonia vaccine.  Shingles vaccine. Your health care provider may also recommend other immunizations. This information is not intended to replace advice given to you by your health care provider. Make sure you discuss any questions you have with your health care provider. Document Released: 11/02/2005 Document Revised: 03/30/2016 Document Reviewed: 06/14/2015 Elsevier Interactive Patient Education  2019 [place].

## 2019-03-09 NOTE — Progress Notes (Signed)
[place] 1961-02-22 [id number]    History:    Presents for annual exam.  2004 TAH with [facility] for fibroids and endometriosis.  On no HRT.  Normal Pap and mammogram history.  2018- colonoscopy.  Primary care manages hypertension, hypothyroidism, hypercholesteremia and asthma.  Has lost 15 pounds in the past year with diet and exercise.  2017 normal DEXA.  Partner years.  Past medical history, past surgical history, family history and social history were all reviewed and documented in the EPIC chart.  Works in [employer] [place]  Father deceased bladder cancer.    ROS:  A ROS was performed and pertinent positives and negatives are included.  Exam:  Vitals:   03/09/19 1206  BP: 128/80  Weight: 199 lb (90.3 kg)  Height: 5\' 3"  (1.6 m)   Body mass index is 35.25 kg/m.   General appearance:  Normal Thyroid:  Symmetrical, normal in size, without palpable masses or nodularity. Respiratory  Auscultation:  Clear without wheezing or rhonchi Cardiovascular  Auscultation:  Regular rate, without rubs, murmurs or gallops  Edema/varicosities:  Not grossly evident Abdominal  Soft,nontender, without masses, guarding or rebound.  Liver/spleen:  No organomegaly noted  Hernia:  None appreciated  Skin  Inspection:  Grossly normal   Breasts: Examined lying and sitting.     Right: Without masses, retractions, discharge or axillary adenopathy.     Left: Without masses, retractions, discharge or axillary adenopathy. Gentitourinary   Inguinal/mons:  Normal without inguinal adenopathy  External genitalia:  Normal  BUS/Urethra/Skene's glands:  Normal  Vagina:  Normal  Cervix: And uterus absent adnexa/parametria:     Rt: Without masses or tenderness.   Lt: Without masses or tenderness.  Anus and perineum: Normal  Digital rectal exam: Normal sphincter tone without palpated masses or tenderness  Assessment/Plan:  58 y.o. SBF G0 for annual exam with no complaints.  2004 TAH with [facility] for  fibroids and endometriosis Hypertension, hypothyroidism, asthma, hypercholesteremia-primary care manages labs and meds Obesity  Plan: Congratulations given on lifestyle changes and weight loss, will continue.  SBEs, continue annual screening mammogram has scheduled today.  Vitamin D 2000 daily encouraged.  Home safety, fall prevention and importance of weightbearing and balance type exercise reviewed and encouraged.  UA.    [place], 12:37 PM 03/09/2019

## 2020-03-08 ENCOUNTER — Other Ambulatory Visit: Payer: Self-pay

## 2020-03-09 ENCOUNTER — Ambulatory Visit (INDEPENDENT_AMBULATORY_CARE_PROVIDER_SITE_OTHER): Payer: 59 | Admitting: Nurse Practitioner

## 2020-03-09 ENCOUNTER — Encounter: Payer: Self-pay | Admitting: Nurse Practitioner

## 2020-03-09 VITALS — BP 124/80 | Ht 63.0 in | Wt 204.0 lb

## 2020-03-09 DIAGNOSIS — Z780 Asymptomatic menopausal state: Secondary | ICD-10-CM | POA: Diagnosis not present

## 2020-03-09 DIAGNOSIS — Z13820 Encounter for screening for osteoporosis: Secondary | ICD-10-CM | POA: Diagnosis not present

## 2020-03-09 DIAGNOSIS — Z01419 Encounter for gynecological examination (general) (routine) without abnormal findings: Secondary | ICD-10-CM | POA: Diagnosis not present

## 2020-03-09 DIAGNOSIS — Z90710 Acquired absence of both cervix and uterus: Secondary | ICD-10-CM

## 2020-03-09 NOTE — Patient Instructions (Signed)
Health Maintenance, Female Adopting a healthy lifestyle and getting preventive care are important in promoting health and wellness. Ask your health care provider about:  The right schedule for you to have regular tests and exams.  Things you can do on your own to prevent diseases and keep yourself healthy. What should I know about diet, weight, and exercise? Eat a healthy diet   Eat a diet that includes plenty of vegetables, fruits, low-fat dairy products, and lean protein.  Do not eat a lot of foods that are high in solid fats, added sugars, or sodium. Maintain a healthy weight Body mass index (BMI) is used to identify weight problems. It estimates body fat based on height and weight. Your health care provider can help determine your BMI and help you achieve or maintain a healthy weight. Get regular exercise Get regular exercise. This is one of the most important things you can do for your health. Most adults should:  Exercise for at least 150 minutes each week. The exercise should increase your heart rate and make you sweat (moderate-intensity exercise).  Do strengthening exercises at least twice a week. This is in addition to the moderate-intensity exercise.  Spend less time sitting. Even light physical activity can be beneficial. Watch cholesterol and blood lipids Have your blood tested for lipids and cholesterol at 59 years of age, then have this test every 5 years. Have your cholesterol levels checked more often if:  Your lipid or cholesterol levels are high.  You are older than 59 years of age.  You are at high risk for heart disease. What should I know about cancer screening? Depending on your health history and family history, you may need to have cancer screening at various ages. This may include screening for:  Breast cancer.  Cervical cancer.  Colorectal cancer.  Skin cancer.  Lung cancer. What should I know about heart disease, diabetes, and high blood  pressure? Blood pressure and heart disease  High blood pressure causes heart disease and increases the risk of stroke. This is more likely to develop in people who have high blood pressure readings, are of African descent, or are overweight.  Have your blood pressure checked: ? Every 3-5 years if you are 18-39 years of age. ? Every year if you are 40 years old or older. Diabetes Have regular diabetes screenings. This checks your fasting blood sugar level. Have the screening done:  Once every three years after age 40 if you are at a normal weight and have a low risk for diabetes.  More often and at a younger age if you are overweight or have a high risk for diabetes. What should I know about preventing infection? Hepatitis B If you have a higher risk for hepatitis B, you should be screened for this virus. Talk with your health care provider to find out if you are at risk for hepatitis B infection. Hepatitis C Testing is recommended for:  Everyone born from 1945 through 1965.  Anyone with known risk factors for hepatitis C. Sexually transmitted infections (STIs)  Get screened for STIs, including gonorrhea and chlamydia, if: ? You are sexually active and are younger than 59 years of age. ? You are older than 59 years of age and your health care provider tells you that you are at risk for this type of infection. ? Your sexual activity has changed since you were last screened, and you are at increased risk for chlamydia or gonorrhea. Ask your health care provider if   you are at risk.  Ask your health care provider about whether you are at high risk for HIV. Your health care provider may recommend a prescription medicine to help prevent HIV infection. If you choose to take medicine to prevent HIV, you should first get tested for HIV. You should then be tested every 3 months for as long as you are taking the medicine. Pregnancy  If you are about to stop having your period (premenopausal) and  you may become pregnant, seek counseling before you get pregnant.  Take 400 to 800 micrograms (mcg) of folic acid every day if you become pregnant.  Ask for birth control (contraception) if you want to prevent pregnancy. Osteoporosis and menopause Osteoporosis is a disease in which the bones lose minerals and strength with aging. This can result in bone fractures. If you are 65 years old or older, or if you are at risk for osteoporosis and fractures, ask your health care provider if you should:  Be screened for bone loss.  Take a calcium or vitamin D supplement to lower your risk of fractures.  Be given hormone replacement therapy (HRT) to treat symptoms of menopause. Follow these instructions at home: Lifestyle  Do not use any products that contain nicotine or tobacco, such as cigarettes, e-cigarettes, and chewing tobacco. If you need help quitting, ask your health care provider.  Do not use street drugs.  Do not share needles.  Ask your health care provider for help if you need support or information about quitting drugs. Alcohol use  Do not drink alcohol if: ? Your health care provider tells you not to drink. ? You are pregnant, may be pregnant, or are planning to become pregnant.  If you drink alcohol: ? Limit how much you use to 0-1 drink a day. ? Limit intake if you are breastfeeding.  Be aware of how much alcohol is in your drink. In the [place] one drink equals one 12 oz bottle of beer (355 mL), one 5 oz glass of wine (148 mL), or one 1 oz glass of hard liquor (44 mL). General instructions  Schedule regular health, dental, and eye exams.  Stay current with your vaccines.  Tell your health care provider if: ? You often feel depressed. ? You have ever been abused or do not feel safe at home. Summary  Adopting a healthy lifestyle and getting preventive care are important in promoting health and wellness.  Follow your health care provider's instructions about healthy  diet, exercising, and getting tested or screened for diseases.  Follow your health care provider's instructions on monitoring your cholesterol and blood pressure. This information is not intended to replace advice given to you by your health care provider. Make sure you discuss any questions you have with your health care provider. Document Revised: 09/03/2018 Document Reviewed: 09/03/2018 Elsevier Patient Education  2020 [employer]  

## 2020-03-09 NOTE — Progress Notes (Signed)
° °  [place] [birth year] [id number]   History:  59 y.o. G0 presents for annual exam without GYN complaints. 2004 TAH [facility] for fibroids and endometriosis.  No HRT.  Normal Pap and mammogram history.  Primary care manages hypertension, hypothyroidism, hypercholesterolemia, and asthma.  Same partner for many years.  Gynecologic History No LMP recorded. Patient has had a hysterectomy.   Last Pap: no longer screening per guidelines and pt request Last mammogram: 03/09/2019. Results were: normal Last colonoscopy: 2018. Results were: normal Last Dexa: 03/13/2016. Results were: Normal, 2 year repeat recommended  Past medical history, past surgical history, family history and social history were all reviewed and documented in the EPIC chart. Works for post [employer].   ROS:  A ROS was performed and pertinent positives and negatives are included.  Exam:  Vitals:   03/09/20 1015  BP: 124/80  Weight: 204 lb (92.5 kg)  Height: 5\' 3"  (1.6 m)   Body mass index is 36.14 kg/m.  General appearance:  Normal Thyroid:  Symmetrical, normal in size, without palpable masses or nodularity. Respiratory  Auscultation:  Clear without wheezing or rhonchi Cardiovascular  Auscultation:  Regular rate, without rubs, murmurs or gallops  Edema/varicosities:  Not grossly evident Abdominal  Soft,nontender, without masses, guarding or rebound.  Liver/spleen:  No organomegaly noted  Hernia:  None appreciated  Skin  Inspection:  Grossly normal   Breasts: Examined lying and sitting.   Right: Without masses, retractions, discharge or axillary adenopathy.   Left: Without masses, retractions, discharge or axillary adenopathy. Gentitourinary   Inguinal/mons:  Normal without inguinal adenopathy  External genitalia:  Normal  BUS/Urethra/Skene's glands:  Normal  Vagina:  Normal  Cervix:  Absent  Uterus:  Absent  Adnexa/parametria:     Rt: Without masses or tenderness.   Lt: Without masses or tenderness.  Anus and  perineum: Normal  Assessment/Plan:  59 y.o. G0 for annual exam without GYN complaints.   Well female exam with routine gynecological exam - Education provided on SBEs, importance of preventative screenings, current guidelines, high calcium diet, regular exercise, and multivitamin daily.   Screening for osteoporosis - Plan: [facility] Bone Density. Overdue. Pt will schedule this soon  Postmenopausal - Plan: [facility] Bone Density  History of total abdominal hysterectomy - [facility]SO. No HRT  Screening for breast cancer  Mammogram scheduled for today  Follow up in 1 year for annual    [address] [facility], 11:02 AM 03/09/2020

## 2020-03-22 ENCOUNTER — Other Ambulatory Visit: Payer: Self-pay

## 2020-03-22 ENCOUNTER — Ambulatory Visit (INDEPENDENT_AMBULATORY_CARE_PROVIDER_SITE_OTHER): Payer: 59

## 2020-03-22 DIAGNOSIS — Z13820 Encounter for screening for osteoporosis: Secondary | ICD-10-CM

## 2020-03-22 DIAGNOSIS — Z780 Asymptomatic menopausal state: Secondary | ICD-10-CM | POA: Diagnosis not present

## 2021-03-13 ENCOUNTER — Other Ambulatory Visit: Payer: Self-pay

## 2021-03-13 ENCOUNTER — Ambulatory Visit (INDEPENDENT_AMBULATORY_CARE_PROVIDER_SITE_OTHER): Payer: POS | Admitting: Nurse Practitioner

## 2021-03-13 ENCOUNTER — Encounter: Payer: Self-pay | Admitting: Nurse Practitioner

## 2021-03-13 VITALS — BP 120/72 | HR 82 | Ht 62.0 in | Wt 199.6 lb

## 2021-03-13 DIAGNOSIS — Z90710 Acquired absence of both cervix and uterus: Secondary | ICD-10-CM | POA: Diagnosis not present

## 2021-03-13 DIAGNOSIS — Z780 Asymptomatic menopausal state: Secondary | ICD-10-CM

## 2021-03-13 DIAGNOSIS — Z01419 Encounter for gynecological examination (general) (routine) without abnormal findings: Secondary | ICD-10-CM

## 2021-03-13 NOTE — Progress Notes (Signed)
   Charlene Mitchell 1961/06/21 [id number]   History:  60 y.o. G0 presents for annual exam without GYN complaints. Postmenopausal - no HRT. 2004 TAH LSO for fibroids and endometriosis. Normal pap and mammogram history. HTN, hypothyroidism, HLD, asthma managed by PCP.   Gynecologic History No LMP recorded. Patient has had a hysterectomy.   Contraception/Family planning: status post hysterectomy  Health Maintenance Last Pap: No longer screening per guidelines Last mammogram: 03/14/2020. Results were: Normal Last colonoscopy: 2018. Results were: Normal Last Dexa: 03/22/2020. Results were: Normal  Past medical history, past surgical history, family history and social history were all reviewed and documented in the EPIC chart. Boyfriend. Works for [employer].   ROS:  A ROS was performed and pertinent positives and negatives are included.  Exam:  Vitals:   03/13/21 1026  BP: 120/72  Pulse: 82  SpO2: 98%  Weight: 199 lb 9.6 oz (90.5 kg)  Height: 5\' 2"  (1.575 m)   Body mass index is 36.51 kg/m.  General appearance:  Normal Thyroid:  Symmetrical, normal in size, without palpable masses or nodularity. Respiratory  Auscultation:  Clear without wheezing or rhonchi Cardiovascular  Auscultation:  Regular rate, without rubs, murmurs or gallops  Edema/varicosities:  Not grossly evident Abdominal  Soft,nontender, without masses, guarding or rebound.  Liver/spleen:  No organomegaly noted  Hernia:  None appreciated  Skin  Inspection:  Grossly normal Breasts: Examined lying and sitting.   Right: Without masses, retractions, nipple discharge or axillary adenopathy.   Left: Without masses, retractions, nipple discharge or axillary adenopathy. Genitourinary   Inguinal/mons:  Normal without inguinal adenopathy  External genitalia:  Normal appearing vulva with no masses, tenderness, or lesions  BUS/Urethra/Skene's glands:  Normal  Vagina:  Normal appearing with normal color and discharge, no  lesions  Cervix:  Absent  Uterus:  Absent  Anus and perineum: Normal  Digital rectal exam: Normal sphincter tone without palpated masses or tenderness  Assessment/Plan:  60 y.o. G0 for annual exam.   Well female exam with routine gynecological exam - Education provided on SBEs, importance of preventative screenings, current guidelines, high calcium diet, regular exercise, and multivitamin daily. Labs with PCP.   History of total abdominal hysterectomy - with LSO for endometriosis and fibroids - 2004  Postmenopausal - No HRT  Screening for cervical cancer - Normal Pap history. No longer screening per guidelines.   Screening for breast cancer - Normal mammogram history.  Continue annual screenings.  Normal breast exam today. Mammogram scheduled today.   Screening for colon cancer - 2018 colonoscopy. Will repeat at GI's recommended interval.   Return in 1 year for annual.    2019 DNP, 10:42 AM 03/13/2021

## 2022-03-14 ENCOUNTER — Ambulatory Visit: Payer: POS | Admitting: Nurse Practitioner

## 2022-03-20 ENCOUNTER — Encounter: Payer: Self-pay | Admitting: Nurse Practitioner

## 2022-03-20 ENCOUNTER — Encounter: Payer: Self-pay | Admitting: Anesthesiology

## 2022-03-20 ENCOUNTER — Ambulatory Visit (INDEPENDENT_AMBULATORY_CARE_PROVIDER_SITE_OTHER): Payer: POS | Admitting: Nurse Practitioner

## 2022-03-20 VITALS — BP 118/74 | Ht 63.0 in | Wt 197.0 lb

## 2022-03-20 DIAGNOSIS — Z780 Asymptomatic menopausal state: Secondary | ICD-10-CM

## 2022-03-20 DIAGNOSIS — Z01419 Encounter for gynecological examination (general) (routine) without abnormal findings: Secondary | ICD-10-CM

## 2022-03-20 NOTE — Progress Notes (Signed)
   Charlene Mitchell 1960/12/01 [id number]   History:  61 y.o. G0 presents for annual exam without GYN complaints. Postmenopausal - no HRT. S/P 2004 TAH LSO for fibroids and endometriosis. Normal pap and mammogram history. HTN, hypothyroidism, HLD, asthma managed by PCP.   Gynecologic History No LMP recorded. Patient has had a hysterectomy.   Contraception/Family planning: status post hysterectomy Sexually active: No  Health Maintenance Last Pap: 01/31/2011. Results were: Normal Last mammogram: 03/20/2022. Results were: No report yet Last colonoscopy: 2018. 5-year recall Last Dexa: 03/22/2020. Results were: Normal  Past medical history, past surgical history, family history and social history were all reviewed and documented in the EPIC chart. Boyfriend of 16 years. Works for post [employer].   ROS:  A ROS was performed and pertinent positives and negatives are included.  Exam:  Vitals:   03/20/22 0953  BP: 118/74  Weight: 197 lb (89.4 kg)  Height: 5\' 3"  (1.6 m)    Body mass index is 34.9 kg/m.  General appearance:  Normal Thyroid:  Symmetrical, normal in size, without palpable masses or nodularity. Respiratory  Auscultation:  Clear without wheezing or rhonchi Cardiovascular  Auscultation:  Regular rate, without rubs, murmurs or gallops  Edema/varicosities:  Not grossly evident Abdominal  Soft,nontender, without masses, guarding or rebound.  Liver/spleen:  No organomegaly noted  Hernia:  None appreciated  Skin  Inspection:  Grossly normal Breasts: Examined lying and sitting.   Right: Without masses, retractions, nipple discharge or axillary adenopathy.   Left: Without masses, retractions, nipple discharge or axillary adenopathy. Genitourinary   Inguinal/mons:  Normal without inguinal adenopathy  External genitalia:  Normal appearing vulva with no masses, tenderness, or lesions  BUS/Urethra/Skene's glands:  Normal  Vagina:  Normal appearing with normal color and discharge, no  lesions  Cervix:  Absent  Uterus:  Absent  Anus and perineum: Normal  Digital rectal exam: Normal sphincter tone without palpated masses or tenderness  Patient informed chaperone available to be present for breast and pelvic exam. Patient has requested no chaperone to be present. Patient has been advised what will be completed during breast and pelvic exam.   Assessment/Plan:  61 y.o. G0 for annual exam.   Well female exam with routine gynecological exam - Education provided on SBEs, importance of preventative screenings, current guidelines, high calcium diet, regular exercise, and multivitamin daily. Labs with PCP.   Postmenopausal - No HRT. S/P 2004 TAH LSO for fibroids and endometriosis.   Screening for cervical cancer - Normal Pap history. No longer screening per guidelines.   Screening for breast cancer - Normal mammogram history.  Continue annual screenings.  Normal breast exam today. Mammogram scheduled today.   Screening for colon cancer - 2018 colonoscopy. Due for repeat this year. Plans to schedule this soon.   Screening for osteoporosis - Normal bone density 2021. Will repeat at 5-year interval per recommendation.   Return in 1 year for annual.      Olivia Mackie DNP, 10:12 AM 03/20/2022

## 2023-03-25 ENCOUNTER — Encounter: Payer: Self-pay | Admitting: Nurse Practitioner

## 2023-03-25 ENCOUNTER — Ambulatory Visit (INDEPENDENT_AMBULATORY_CARE_PROVIDER_SITE_OTHER): Payer: Commercial Managed Care - PPO | Admitting: Nurse Practitioner

## 2023-03-25 VITALS — BP 108/68 | HR 77 | Ht 62.0 in | Wt 203.0 lb

## 2023-03-25 DIAGNOSIS — Z01419 Encounter for gynecological examination (general) (routine) without abnormal findings: Secondary | ICD-10-CM

## 2023-03-25 DIAGNOSIS — Z780 Asymptomatic menopausal state: Secondary | ICD-10-CM

## 2023-03-25 NOTE — Progress Notes (Signed)
   Charlene Mitchell [birth year] [id number]   History:  62 y.o. G0 presents for annual exam without GYN complaints. Postmenopausal - no HRT. S/P 2004 TAH LSO for fibroids and endometriosis. Normal pap and mammogram history. HTN, hypothyroidism, HLD, asthma managed by PCP.   Gynecologic History No LMP recorded. Patient has had a hysterectomy.   Contraception/Family planning: status post hysterectomy Sexually active: No  Health Maintenance Last Pap: 01/31/2011. Results were: Normal Last mammogram: Today. Results were: No report yet Last colonoscopy: 02/2023. Results: Normal, 5-year recall Last Dexa: 03/22/2020. Results were: Normal  Past medical history, past surgical history, family history and social history were all reviewed and documented in the EPIC chart. Boyfriend of 17 years. Works for [employer].   ROS:  A ROS was performed and pertinent positives and negatives are included.  Exam:  Vitals:   03/25/23 1324  BP: 108/68  Pulse: 77  SpO2: 100%  Weight: 203 lb (92.1 kg)  Height: 5\' 2"  (1.575 m)     Body mass index is 37.13 kg/m.  General appearance:  Normal Thyroid:  Symmetrical, normal in size, without palpable masses or nodularity. Respiratory  Auscultation:  Clear without wheezing or rhonchi Cardiovascular  Auscultation:  Regular rate, without rubs, murmurs or gallops  Edema/varicosities:  Not grossly evident Abdominal  Soft,nontender, without masses, guarding or rebound.  Liver/spleen:  No organomegaly noted  Hernia:  None appreciated  Skin  Inspection:  Grossly normal Breasts: Examined lying and sitting.   Right: Without masses, retractions, nipple discharge or axillary adenopathy.   Left: Without masses, retractions, nipple discharge or axillary adenopathy. Genitourinary   Inguinal/mons:  Normal without inguinal adenopathy  External genitalia:  Normal appearing vulva with no masses, tenderness, or lesions  BUS/Urethra/Skene's glands:  Normal  Vagina:  Normal  appearing with normal color and discharge, no lesions  Cervix:  Absent  Uterus:  Absent  Anus and perineum: Normal  Digital rectal exam: Deferred  Patient informed chaperone available to be present for breast and pelvic exam. Patient has requested no chaperone to be present. Patient has been advised what will be completed during breast and pelvic exam.   Assessment/Plan:  62 y.o. G0 for annual exam.   Well female exam with routine gynecological exam - Education provided on SBEs, importance of preventative screenings, current guidelines, high calcium diet, regular exercise, and multivitamin daily. Labs with PCP.   Postmenopausal - No HRT. S/P 2004 TAH LSO for fibroids and endometriosis.   Screening for cervical cancer - Normal Pap history. No longer screening per guidelines.   Screening for breast cancer - Normal mammogram history.  Continue annual screenings.  Normal breast exam today. Mammogram today.   Screening for colon cancer - Colonoscopy last month. Normal per patient. 5-year recall.  Screening for osteoporosis - Normal bone density 2021. Will repeat at 5-year interval per recommendation.   Return in 1 year for annual.      Olivia Mackie DNP, 1:33 PM 03/25/2023

## 2023-03-27 ENCOUNTER — Encounter: Payer: Self-pay | Admitting: Nurse Practitioner

## 2024-03-30 ENCOUNTER — Ambulatory Visit: Payer: Commercial Managed Care - PPO | Admitting: Nurse Practitioner

## 2024-03-30 LAB — HM MAMMOGRAPHY

## 2024-04-03 ENCOUNTER — Ambulatory Visit (INDEPENDENT_AMBULATORY_CARE_PROVIDER_SITE_OTHER): Admitting: Nurse Practitioner

## 2024-04-03 ENCOUNTER — Encounter: Payer: Self-pay | Admitting: Nurse Practitioner

## 2024-04-03 VITALS — BP 118/80 | HR 74 | Ht 62.0 in | Wt 212.0 lb

## 2024-04-03 DIAGNOSIS — Z780 Asymptomatic menopausal state: Secondary | ICD-10-CM | POA: Diagnosis not present

## 2024-04-03 DIAGNOSIS — Z01419 Encounter for gynecological examination (general) (routine) without abnormal findings: Secondary | ICD-10-CM

## 2024-04-03 DIAGNOSIS — Z1331 Encounter for screening for depression: Secondary | ICD-10-CM

## 2024-04-03 NOTE — Progress Notes (Signed)
   Charlene Mitchell 1962-06-25 [id number]   History:  63 y.o. G0 presents for annual exam without GYN complaints. Postmenopausal - no HRT. S/P 2004 TAH LSO for fibroids and endometriosis. Normal pap and mammogram history. HTN, hypothyroidism, HLD, asthma managed by PCP.   Gynecologic History No LMP recorded. Patient has had a hysterectomy.   Contraception/Family planning: status post hysterectomy Sexually active: No  Health Maintenance Last Pap: 01/31/2011. Results were: Normal Last mammogram: 03/30/2024. Results were: Normal Last colonoscopy: 02/2023. Results: Normal, 5-year recall Last Dexa: 03/22/2020. Results were: Normal  Past medical history, past surgical history, family history and social history were all reviewed and documented in the EPIC chart. Boyfriend of 18 years. Works for post [employer].   ROS:  A ROS was performed and pertinent positives and negatives are included.  Exam:  Vitals:   04/03/24 0848  BP: 118/80  Pulse: 74  SpO2: 99%  Weight: 212 lb (96.2 kg)  Height: 5' 2 (1.575 m)      Body mass index is 38.78 kg/m.  General appearance:  Normal Thyroid:  Symmetrical, normal in size, without palpable masses or nodularity. Respiratory  Auscultation:  Clear without wheezing or rhonchi Cardiovascular  Auscultation:  Regular rate, without rubs, murmurs or gallops  Edema/varicosities:  Not grossly evident Abdominal  Soft,nontender, without masses, guarding or rebound.  Liver/spleen:  No organomegaly noted  Hernia:  None appreciated  Skin  Inspection:  Grossly normal Breasts: Examined lying and sitting.   Right: Without masses, retractions, nipple discharge or axillary adenopathy.   Left: Without masses, retractions, nipple discharge or axillary adenopathy. Pelvic: External genitalia:  no lesions              Urethra:  normal appearing urethra with no masses, tenderness or lesions              Bartholins and Skenes: normal                 Vagina: normal appearing  vagina with normal color and discharge, no lesions              Cervix: absent Bimanual Exam:  Uterus: absent              Adnexa: no mass, fullness, tenderness              Rectovaginal: Deferred              Anus:  normal, no lesions  Dereck Keas, CMA present as chaperone.   Assessment/Plan:  63 y.o. G0 for annual exam.   Well female exam with routine gynecological exam - Education provided on SBEs, importance of preventative screenings, current guidelines, high calcium diet, regular exercise, and multivitamin daily. Labs with PCP.   Postmenopausal - No HRT. S/P 2004 TAH LSO for fibroids and endometriosis.   Screening for cervical cancer - Normal Pap history. No longer screening per guidelines.   Screening for breast cancer - Normal mammogram history.  Continue annual screenings.  Normal breast exam today.  Screening for colon cancer - Colonoscopy 2024, 5-year recall.  Screening for osteoporosis - Normal bone density 2021. Will repeat at 5-year interval per recommendation.   Return in about 1 year (around 04/03/2025) for Annual.     Charlene DELENA Shutter DNP, 9:00 AM 04/03/2024

## 2025-04-05 ENCOUNTER — Ambulatory Visit: Admitting: Nurse Practitioner

## 2025-04-14 ENCOUNTER — Ambulatory Visit: Admitting: Nurse Practitioner
